# Patient Record
Sex: Female | Born: 1955 | Race: White | Hispanic: No | Marital: Married | Smoking: Never smoker
Health system: Southern US, Community
[De-identification: ages and names within clinical notes are randomized; demographics above are authoritative.]

## PROBLEM LIST (undated history)

## (undated) DIAGNOSIS — C801 Malignant (primary) neoplasm, unspecified: Secondary | ICD-10-CM

## (undated) DIAGNOSIS — D219 Benign neoplasm of connective and other soft tissue, unspecified: Secondary | ICD-10-CM

## (undated) DIAGNOSIS — I10 Essential (primary) hypertension: Secondary | ICD-10-CM

## (undated) DIAGNOSIS — E2740 Unspecified adrenocortical insufficiency: Secondary | ICD-10-CM

## (undated) HISTORY — PX: OTHER SURGICAL HISTORY: SHX169

## (undated) HISTORY — DX: Malignant (primary) neoplasm, unspecified: C80.1

## (undated) HISTORY — PX: TUBAL LIGATION: SHX77

## (undated) HISTORY — PX: ENDOMETRIAL ABLATION: SHX621

## (undated) HISTORY — DX: Essential (primary) hypertension: I10

## (undated) HISTORY — DX: Benign neoplasm of connective and other soft tissue, unspecified: D21.9

---

## 1998-07-17 ENCOUNTER — Other Ambulatory Visit: Admission: RE | Admit: 1998-07-17 | Discharge: 1998-07-17 | Payer: Self-pay | Admitting: Obstetrics and Gynecology

## 1998-08-01 ENCOUNTER — Ambulatory Visit (HOSPITAL_COMMUNITY): Admission: RE | Admit: 1998-08-01 | Discharge: 1998-08-01 | Payer: Self-pay | Admitting: Obstetrics and Gynecology

## 2000-04-25 ENCOUNTER — Ambulatory Visit (HOSPITAL_COMMUNITY): Admission: RE | Admit: 2000-04-25 | Discharge: 2000-04-25 | Payer: Self-pay | Admitting: Obstetrics and Gynecology

## 2000-04-25 ENCOUNTER — Encounter: Payer: Self-pay | Admitting: Obstetrics and Gynecology

## 2000-06-22 ENCOUNTER — Other Ambulatory Visit: Admission: RE | Admit: 2000-06-22 | Discharge: 2000-06-22 | Payer: Self-pay | Admitting: Obstetrics and Gynecology

## 2001-04-27 ENCOUNTER — Encounter: Payer: Self-pay | Admitting: Obstetrics and Gynecology

## 2001-04-27 ENCOUNTER — Ambulatory Visit (HOSPITAL_COMMUNITY): Admission: RE | Admit: 2001-04-27 | Discharge: 2001-04-27 | Payer: Self-pay | Admitting: Obstetrics and Gynecology

## 2002-08-16 ENCOUNTER — Encounter: Payer: Self-pay | Admitting: Obstetrics and Gynecology

## 2002-08-16 ENCOUNTER — Ambulatory Visit (HOSPITAL_COMMUNITY): Admission: RE | Admit: 2002-08-16 | Discharge: 2002-08-16 | Payer: Self-pay | Admitting: Obstetrics and Gynecology

## 2002-08-29 ENCOUNTER — Other Ambulatory Visit: Admission: RE | Admit: 2002-08-29 | Discharge: 2002-08-29 | Payer: Self-pay | Admitting: Obstetrics and Gynecology

## 2003-07-02 ENCOUNTER — Other Ambulatory Visit: Admission: RE | Admit: 2003-07-02 | Discharge: 2003-07-02 | Payer: Self-pay | Admitting: Obstetrics and Gynecology

## 2004-04-30 ENCOUNTER — Ambulatory Visit: Payer: Self-pay | Admitting: Family Medicine

## 2004-05-11 ENCOUNTER — Other Ambulatory Visit: Admission: RE | Admit: 2004-05-11 | Discharge: 2004-05-11 | Payer: Self-pay | Admitting: Obstetrics and Gynecology

## 2005-04-14 ENCOUNTER — Ambulatory Visit: Payer: Self-pay | Admitting: Gastroenterology

## 2005-05-26 ENCOUNTER — Other Ambulatory Visit: Admission: RE | Admit: 2005-05-26 | Discharge: 2005-05-26 | Payer: Self-pay | Admitting: Obstetrics and Gynecology

## 2006-05-16 ENCOUNTER — Other Ambulatory Visit: Admission: RE | Admit: 2006-05-16 | Discharge: 2006-05-16 | Payer: Self-pay | Admitting: Obstetrics and Gynecology

## 2006-12-23 ENCOUNTER — Ambulatory Visit: Payer: Self-pay | Admitting: Gastroenterology

## 2007-01-12 ENCOUNTER — Ambulatory Visit: Payer: Self-pay | Admitting: Gastroenterology

## 2007-02-22 ENCOUNTER — Ambulatory Visit: Payer: Self-pay | Admitting: Gastroenterology

## 2007-02-22 LAB — CONVERTED CEMR LAB
Fecal Occult Blood: NEGATIVE
OCCULT 1: NEGATIVE
OCCULT 2: NEGATIVE
OCCULT 3: NEGATIVE
OCCULT 4: NEGATIVE
OCCULT 5: NEGATIVE

## 2007-07-11 ENCOUNTER — Other Ambulatory Visit: Admission: RE | Admit: 2007-07-11 | Discharge: 2007-07-11 | Payer: Self-pay | Admitting: Obstetrics and Gynecology

## 2008-02-07 ENCOUNTER — Ambulatory Visit: Payer: Self-pay | Admitting: Obstetrics and Gynecology

## 2008-02-14 ENCOUNTER — Ambulatory Visit: Payer: Self-pay | Admitting: Obstetrics and Gynecology

## 2008-03-04 ENCOUNTER — Ambulatory Visit: Payer: Self-pay | Admitting: Obstetrics and Gynecology

## 2008-03-25 ENCOUNTER — Ambulatory Visit: Payer: Self-pay | Admitting: Obstetrics and Gynecology

## 2008-05-22 ENCOUNTER — Ambulatory Visit: Payer: Self-pay | Admitting: Obstetrics and Gynecology

## 2008-07-16 ENCOUNTER — Encounter: Payer: Self-pay | Admitting: Obstetrics and Gynecology

## 2008-07-16 ENCOUNTER — Other Ambulatory Visit: Admission: RE | Admit: 2008-07-16 | Discharge: 2008-07-16 | Payer: Self-pay | Admitting: Obstetrics and Gynecology

## 2008-07-16 ENCOUNTER — Ambulatory Visit: Payer: Self-pay | Admitting: Obstetrics and Gynecology

## 2009-07-22 ENCOUNTER — Other Ambulatory Visit: Admission: RE | Admit: 2009-07-22 | Discharge: 2009-07-22 | Payer: Self-pay | Admitting: Obstetrics and Gynecology

## 2009-07-22 ENCOUNTER — Ambulatory Visit: Payer: Self-pay | Admitting: Obstetrics and Gynecology

## 2010-08-04 ENCOUNTER — Encounter (INDEPENDENT_AMBULATORY_CARE_PROVIDER_SITE_OTHER): Payer: BC Managed Care – PPO | Admitting: Obstetrics and Gynecology

## 2010-08-04 ENCOUNTER — Other Ambulatory Visit: Payer: BC Managed Care – PPO

## 2010-08-04 ENCOUNTER — Other Ambulatory Visit (HOSPITAL_COMMUNITY)
Admission: RE | Admit: 2010-08-04 | Discharge: 2010-08-04 | Disposition: A | Discharge status: Home / Self Care | Payer: BC Managed Care – PPO | Source: Ambulatory Visit | Attending: Obstetrics and Gynecology | Admitting: Obstetrics and Gynecology

## 2010-08-04 ENCOUNTER — Other Ambulatory Visit: Payer: Self-pay | Admitting: Obstetrics and Gynecology

## 2010-08-04 DIAGNOSIS — Z01419 Encounter for gynecological examination (general) (routine) without abnormal findings: Secondary | ICD-10-CM

## 2010-08-04 DIAGNOSIS — Z124 Encounter for screening for malignant neoplasm of cervix: Secondary | ICD-10-CM | POA: Insufficient documentation

## 2010-08-04 DIAGNOSIS — N912 Amenorrhea, unspecified: Secondary | ICD-10-CM

## 2010-08-04 DIAGNOSIS — D251 Intramural leiomyoma of uterus: Secondary | ICD-10-CM

## 2010-08-04 DIAGNOSIS — D259 Leiomyoma of uterus, unspecified: Secondary | ICD-10-CM

## 2010-08-04 DIAGNOSIS — N8300 Follicular cyst of ovary, unspecified side: Secondary | ICD-10-CM

## 2011-07-20 ENCOUNTER — Other Ambulatory Visit: Payer: Self-pay | Admitting: *Deleted

## 2011-07-20 DIAGNOSIS — N6489 Other specified disorders of breast: Secondary | ICD-10-CM

## 2011-07-21 ENCOUNTER — Other Ambulatory Visit: Payer: Self-pay | Admitting: Obstetrics and Gynecology

## 2011-07-21 DIAGNOSIS — N6489 Other specified disorders of breast: Secondary | ICD-10-CM

## 2011-07-23 ENCOUNTER — Encounter: Payer: Self-pay | Admitting: Obstetrics and Gynecology

## 2011-07-29 ENCOUNTER — Encounter: Payer: Self-pay | Admitting: Gynecology

## 2011-07-29 DIAGNOSIS — I10 Essential (primary) hypertension: Secondary | ICD-10-CM | POA: Insufficient documentation

## 2011-08-09 ENCOUNTER — Ambulatory Visit (INDEPENDENT_AMBULATORY_CARE_PROVIDER_SITE_OTHER): Payer: BC Managed Care – PPO

## 2011-08-09 ENCOUNTER — Other Ambulatory Visit (HOSPITAL_COMMUNITY)
Admission: RE | Admit: 2011-08-09 | Discharge: 2011-08-09 | Disposition: A | Discharge status: Home / Self Care | Payer: BC Managed Care – PPO | Source: Ambulatory Visit | Attending: Obstetrics and Gynecology | Admitting: Obstetrics and Gynecology

## 2011-08-09 ENCOUNTER — Encounter: Payer: Self-pay | Admitting: Obstetrics and Gynecology

## 2011-08-09 ENCOUNTER — Ambulatory Visit (INDEPENDENT_AMBULATORY_CARE_PROVIDER_SITE_OTHER): Payer: BC Managed Care – PPO | Admitting: Obstetrics and Gynecology

## 2011-08-09 ENCOUNTER — Other Ambulatory Visit: Payer: Self-pay | Admitting: Obstetrics and Gynecology

## 2011-08-09 VITALS — BP 130/84 | Ht 66.0 in | Wt 180.0 lb

## 2011-08-09 DIAGNOSIS — C801 Malignant (primary) neoplasm, unspecified: Secondary | ICD-10-CM | POA: Insufficient documentation

## 2011-08-09 DIAGNOSIS — R1031 Right lower quadrant pain: Secondary | ICD-10-CM

## 2011-08-09 DIAGNOSIS — N912 Amenorrhea, unspecified: Secondary | ICD-10-CM

## 2011-08-09 DIAGNOSIS — Z01419 Encounter for gynecological examination (general) (routine) without abnormal findings: Secondary | ICD-10-CM

## 2011-08-09 DIAGNOSIS — N83339 Acquired atrophy of ovary and fallopian tube, unspecified side: Secondary | ICD-10-CM

## 2011-08-09 DIAGNOSIS — D251 Intramural leiomyoma of uterus: Secondary | ICD-10-CM

## 2011-08-09 DIAGNOSIS — D259 Leiomyoma of uterus, unspecified: Secondary | ICD-10-CM

## 2011-08-09 NOTE — Progress Notes (Addendum)
Patient came to see me today for her annual GYN exam. She had an elevated FSH last year. She has complete amenorrhea. Her hot flashes are subsiding. 3 weeks ago she had sharp right lower quadrant pain. It is now better. It has responded to Advil. On followup for her melanoma the dermatologist found the cyst on her vulva. The patient is asymptomatic. She has not had a bone density. She does her lab work from her PCP.she's never had an abnormal Pap smear. She understands the new guidelines but requested Pap smear today.  Physical examination: Kennon Portela present. HEENT within normal limits. Neck: Thyroid not large. No masses. Supraclavicular nodes: not enlarged. Breasts: Examined in both sitting and lying  position. No skin changes and no masses. Abdomen: Soft no guarding rebound or masses or hernia. Pelvic: External: Within normal limits. BUS: Within normal limits except for 2 mm sebaceous cyst on left labia-nonsuspicious. Vaginal:within normal limits. Good estrogen effect. No evidence of cystocele rectocele or enterocele. Cervix: clean. Uterus: Normal size and shape. Adnexa: No masses. Rectovaginal exam: Confirmatory and negative. Extremities: Within normal limits.  Assessment: #1. Recent right lower quadrant pain #2. Small sebaceous cyst left labia. #3. History of melanoma  Plan: Continue yearly mammograms. Bone density. Pelvic ultrasound.  Patient came back today and we went ahead and did her ultrasound. 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 The last time we did an ultrasound on her it was 2.5 cm. Her endometrial echo is 3.2 mm. Both her ovaries are normal. Her cul-de-sac is free of fluid. No masses were appreciated. The patient was reassured.

## 2011-08-10 LAB — URINALYSIS W MICROSCOPIC + REFLEX CULTURE
Bacteria, UA: NONE SEEN
Bilirubin Urine: NEGATIVE
Casts: NONE SEEN
Crystals: NONE SEEN
Glucose, UA: NEGATIVE mg/dL
Hgb urine dipstick: NEGATIVE
Ketones, ur: NEGATIVE mg/dL
Leukocytes, UA: NEGATIVE
Nitrite: NEGATIVE
Protein, ur: NEGATIVE mg/dL
Specific Gravity, Urine: 1.022 (ref 1.005–1.030)
Squamous Epithelial / HPF: NONE SEEN
Urobilinogen, UA: 0.2 mg/dL (ref 0.0–1.0)
pH: 7.5 (ref 5.0–8.0)

## 2014-01-07 ENCOUNTER — Encounter: Payer: Self-pay | Admitting: Obstetrics and Gynecology

## 2015-06-10 DIAGNOSIS — E230 Hypopituitarism: Secondary | ICD-10-CM | POA: Diagnosis not present

## 2015-06-10 DIAGNOSIS — E2749 Other adrenocortical insufficiency: Secondary | ICD-10-CM | POA: Diagnosis not present

## 2015-06-10 DIAGNOSIS — M79674 Pain in right toe(s): Secondary | ICD-10-CM | POA: Diagnosis not present

## 2015-06-10 DIAGNOSIS — E038 Other specified hypothyroidism: Secondary | ICD-10-CM | POA: Diagnosis not present

## 2015-06-17 DIAGNOSIS — Z01419 Encounter for gynecological examination (general) (routine) without abnormal findings: Secondary | ICD-10-CM | POA: Diagnosis not present

## 2015-07-10 DIAGNOSIS — Z85828 Personal history of other malignant neoplasm of skin: Secondary | ICD-10-CM | POA: Diagnosis not present

## 2015-07-10 DIAGNOSIS — D492 Neoplasm of unspecified behavior of bone, soft tissue, and skin: Secondary | ICD-10-CM | POA: Diagnosis not present

## 2015-07-10 DIAGNOSIS — D2270 Melanocytic nevi of unspecified lower limb, including hip: Secondary | ICD-10-CM | POA: Diagnosis not present

## 2015-07-10 DIAGNOSIS — B353 Tinea pedis: Secondary | ICD-10-CM | POA: Diagnosis not present

## 2015-07-10 DIAGNOSIS — D899 Disorder involving the immune mechanism, unspecified: Secondary | ICD-10-CM | POA: Diagnosis not present

## 2015-07-10 DIAGNOSIS — R945 Abnormal results of liver function studies: Secondary | ICD-10-CM | POA: Diagnosis not present

## 2015-07-10 DIAGNOSIS — I890 Lymphedema, not elsewhere classified: Secondary | ICD-10-CM | POA: Diagnosis not present

## 2015-07-10 DIAGNOSIS — L565 Disseminated superficial actinic porokeratosis (DSAP): Secondary | ICD-10-CM | POA: Diagnosis not present

## 2015-07-10 DIAGNOSIS — Z9289 Personal history of other medical treatment: Secondary | ICD-10-CM | POA: Diagnosis not present

## 2015-07-10 DIAGNOSIS — L570 Actinic keratosis: Secondary | ICD-10-CM | POA: Diagnosis not present

## 2015-07-10 DIAGNOSIS — C799 Secondary malignant neoplasm of unspecified site: Secondary | ICD-10-CM | POA: Diagnosis not present

## 2015-07-14 DIAGNOSIS — M79671 Pain in right foot: Secondary | ICD-10-CM | POA: Diagnosis not present

## 2015-07-14 DIAGNOSIS — K719 Toxic liver disease, unspecified: Secondary | ICD-10-CM | POA: Diagnosis not present

## 2015-07-14 DIAGNOSIS — C438 Malignant melanoma of overlapping sites of skin: Secondary | ICD-10-CM | POA: Diagnosis not present

## 2015-07-14 DIAGNOSIS — E038 Other specified hypothyroidism: Secondary | ICD-10-CM | POA: Diagnosis not present

## 2015-07-14 DIAGNOSIS — E2749 Other adrenocortical insufficiency: Secondary | ICD-10-CM | POA: Diagnosis not present

## 2015-07-14 DIAGNOSIS — C439 Malignant melanoma of skin, unspecified: Secondary | ICD-10-CM | POA: Diagnosis not present

## 2015-07-14 DIAGNOSIS — C779 Secondary and unspecified malignant neoplasm of lymph node, unspecified: Secondary | ICD-10-CM | POA: Diagnosis not present

## 2015-07-14 DIAGNOSIS — Z9289 Personal history of other medical treatment: Secondary | ICD-10-CM | POA: Diagnosis not present

## 2015-07-15 DIAGNOSIS — M659 Synovitis and tenosynovitis, unspecified: Secondary | ICD-10-CM | POA: Diagnosis not present

## 2015-07-15 DIAGNOSIS — M79671 Pain in right foot: Secondary | ICD-10-CM | POA: Diagnosis not present

## 2015-07-15 DIAGNOSIS — G5761 Lesion of plantar nerve, right lower limb: Secondary | ICD-10-CM | POA: Diagnosis not present

## 2015-08-05 DIAGNOSIS — R05 Cough: Secondary | ICD-10-CM | POA: Diagnosis not present

## 2015-08-05 DIAGNOSIS — I10 Essential (primary) hypertension: Secondary | ICD-10-CM | POA: Diagnosis not present

## 2015-08-05 DIAGNOSIS — Z79899 Other long term (current) drug therapy: Secondary | ICD-10-CM | POA: Diagnosis not present

## 2015-08-05 DIAGNOSIS — R5383 Other fatigue: Secondary | ICD-10-CM | POA: Diagnosis not present

## 2015-08-25 DIAGNOSIS — C779 Secondary and unspecified malignant neoplasm of lymph node, unspecified: Secondary | ICD-10-CM | POA: Diagnosis not present

## 2015-08-25 DIAGNOSIS — Z9289 Personal history of other medical treatment: Secondary | ICD-10-CM | POA: Diagnosis not present

## 2015-08-25 DIAGNOSIS — Z85820 Personal history of malignant melanoma of skin: Secondary | ICD-10-CM | POA: Diagnosis not present

## 2015-08-25 DIAGNOSIS — E2749 Other adrenocortical insufficiency: Secondary | ICD-10-CM | POA: Diagnosis not present

## 2015-08-25 DIAGNOSIS — Z08 Encounter for follow-up examination after completed treatment for malignant neoplasm: Secondary | ICD-10-CM | POA: Diagnosis not present

## 2015-08-25 DIAGNOSIS — R911 Solitary pulmonary nodule: Secondary | ICD-10-CM | POA: Diagnosis not present

## 2015-08-25 DIAGNOSIS — C439 Malignant melanoma of skin, unspecified: Secondary | ICD-10-CM | POA: Diagnosis not present

## 2015-08-25 DIAGNOSIS — E038 Other specified hypothyroidism: Secondary | ICD-10-CM | POA: Diagnosis not present

## 2015-09-16 DIAGNOSIS — E2749 Other adrenocortical insufficiency: Secondary | ICD-10-CM | POA: Diagnosis not present

## 2015-09-16 DIAGNOSIS — E230 Hypopituitarism: Secondary | ICD-10-CM | POA: Diagnosis not present

## 2015-09-16 DIAGNOSIS — E042 Nontoxic multinodular goiter: Secondary | ICD-10-CM | POA: Diagnosis not present

## 2015-09-16 DIAGNOSIS — E038 Other specified hypothyroidism: Secondary | ICD-10-CM | POA: Diagnosis not present

## 2015-09-17 DIAGNOSIS — C799 Secondary malignant neoplasm of unspecified site: Secondary | ICD-10-CM | POA: Diagnosis not present

## 2015-09-17 DIAGNOSIS — L570 Actinic keratosis: Secondary | ICD-10-CM | POA: Diagnosis not present

## 2015-09-22 DIAGNOSIS — K719 Toxic liver disease, unspecified: Secondary | ICD-10-CM | POA: Diagnosis not present

## 2015-10-13 DIAGNOSIS — K719 Toxic liver disease, unspecified: Secondary | ICD-10-CM | POA: Diagnosis not present

## 2015-10-28 DIAGNOSIS — Z79899 Other long term (current) drug therapy: Secondary | ICD-10-CM | POA: Diagnosis not present

## 2015-10-28 DIAGNOSIS — X58XXXA Exposure to other specified factors, initial encounter: Secondary | ICD-10-CM | POA: Diagnosis not present

## 2015-10-28 DIAGNOSIS — L570 Actinic keratosis: Secondary | ICD-10-CM | POA: Diagnosis not present

## 2015-10-28 DIAGNOSIS — D492 Neoplasm of unspecified behavior of bone, soft tissue, and skin: Secondary | ICD-10-CM | POA: Diagnosis not present

## 2015-10-28 DIAGNOSIS — C44729 Squamous cell carcinoma of skin of left lower limb, including hip: Secondary | ICD-10-CM | POA: Diagnosis not present

## 2015-10-28 DIAGNOSIS — C779 Secondary and unspecified malignant neoplasm of lymph node, unspecified: Secondary | ICD-10-CM | POA: Diagnosis not present

## 2015-10-28 DIAGNOSIS — L820 Inflamed seborrheic keratosis: Secondary | ICD-10-CM | POA: Diagnosis not present

## 2015-10-28 DIAGNOSIS — C799 Secondary malignant neoplasm of unspecified site: Secondary | ICD-10-CM | POA: Diagnosis not present

## 2015-10-28 DIAGNOSIS — C4492 Squamous cell carcinoma of skin, unspecified: Secondary | ICD-10-CM | POA: Diagnosis not present

## 2015-10-28 DIAGNOSIS — S6010XA Contusion of unspecified finger with damage to nail, initial encounter: Secondary | ICD-10-CM | POA: Diagnosis not present

## 2015-10-28 DIAGNOSIS — B351 Tinea unguium: Secondary | ICD-10-CM | POA: Diagnosis not present

## 2015-10-29 DIAGNOSIS — C4359 Malignant melanoma of other part of trunk: Secondary | ICD-10-CM | POA: Diagnosis not present

## 2015-10-29 DIAGNOSIS — C779 Secondary and unspecified malignant neoplasm of lymph node, unspecified: Secondary | ICD-10-CM | POA: Diagnosis not present

## 2015-10-29 DIAGNOSIS — C439 Malignant melanoma of skin, unspecified: Secondary | ICD-10-CM | POA: Diagnosis not present

## 2015-11-04 DIAGNOSIS — M25511 Pain in right shoulder: Secondary | ICD-10-CM | POA: Diagnosis not present

## 2015-11-04 DIAGNOSIS — M542 Cervicalgia: Secondary | ICD-10-CM | POA: Diagnosis not present

## 2015-11-18 DIAGNOSIS — Z85828 Personal history of other malignant neoplasm of skin: Secondary | ICD-10-CM | POA: Diagnosis not present

## 2015-11-18 DIAGNOSIS — K719 Toxic liver disease, unspecified: Secondary | ICD-10-CM | POA: Diagnosis not present

## 2015-11-18 DIAGNOSIS — Z9289 Personal history of other medical treatment: Secondary | ICD-10-CM | POA: Diagnosis not present

## 2015-11-24 DIAGNOSIS — C439 Malignant melanoma of skin, unspecified: Secondary | ICD-10-CM | POA: Diagnosis not present

## 2015-11-24 DIAGNOSIS — K719 Toxic liver disease, unspecified: Secondary | ICD-10-CM | POA: Diagnosis not present

## 2015-11-24 DIAGNOSIS — C779 Secondary and unspecified malignant neoplasm of lymph node, unspecified: Secondary | ICD-10-CM | POA: Diagnosis not present

## 2015-11-24 DIAGNOSIS — Z9289 Personal history of other medical treatment: Secondary | ICD-10-CM | POA: Diagnosis not present

## 2015-11-24 DIAGNOSIS — Z85820 Personal history of malignant melanoma of skin: Secondary | ICD-10-CM | POA: Diagnosis not present

## 2015-11-24 DIAGNOSIS — R911 Solitary pulmonary nodule: Secondary | ICD-10-CM | POA: Diagnosis not present

## 2015-11-24 DIAGNOSIS — E2749 Other adrenocortical insufficiency: Secondary | ICD-10-CM | POA: Diagnosis not present

## 2015-11-25 DIAGNOSIS — L905 Scar conditions and fibrosis of skin: Secondary | ICD-10-CM | POA: Diagnosis not present

## 2015-11-25 DIAGNOSIS — L570 Actinic keratosis: Secondary | ICD-10-CM | POA: Diagnosis not present

## 2015-11-25 DIAGNOSIS — D492 Neoplasm of unspecified behavior of bone, soft tissue, and skin: Secondary | ICD-10-CM | POA: Diagnosis not present

## 2015-11-25 DIAGNOSIS — C44729 Squamous cell carcinoma of skin of left lower limb, including hip: Secondary | ICD-10-CM | POA: Diagnosis not present

## 2015-12-02 DIAGNOSIS — C44729 Squamous cell carcinoma of skin of left lower limb, including hip: Secondary | ICD-10-CM | POA: Diagnosis not present

## 2015-12-02 DIAGNOSIS — E2740 Unspecified adrenocortical insufficiency: Secondary | ICD-10-CM | POA: Diagnosis not present

## 2015-12-02 DIAGNOSIS — Z9289 Personal history of other medical treatment: Secondary | ICD-10-CM | POA: Diagnosis not present

## 2015-12-11 DIAGNOSIS — Z23 Encounter for immunization: Secondary | ICD-10-CM | POA: Diagnosis not present

## 2015-12-16 DIAGNOSIS — E2749 Other adrenocortical insufficiency: Secondary | ICD-10-CM | POA: Diagnosis not present

## 2015-12-16 DIAGNOSIS — E230 Hypopituitarism: Secondary | ICD-10-CM | POA: Diagnosis not present

## 2015-12-16 DIAGNOSIS — Z85828 Personal history of other malignant neoplasm of skin: Secondary | ICD-10-CM | POA: Diagnosis not present

## 2015-12-16 DIAGNOSIS — Z483 Aftercare following surgery for neoplasm: Secondary | ICD-10-CM | POA: Diagnosis not present

## 2015-12-16 DIAGNOSIS — E038 Other specified hypothyroidism: Secondary | ICD-10-CM | POA: Diagnosis not present

## 2015-12-16 DIAGNOSIS — K719 Toxic liver disease, unspecified: Secondary | ICD-10-CM | POA: Diagnosis not present

## 2015-12-16 DIAGNOSIS — D235 Other benign neoplasm of skin of trunk: Secondary | ICD-10-CM | POA: Diagnosis not present

## 2015-12-16 DIAGNOSIS — L820 Inflamed seborrheic keratosis: Secondary | ICD-10-CM | POA: Diagnosis not present

## 2015-12-16 DIAGNOSIS — C44729 Squamous cell carcinoma of skin of left lower limb, including hip: Secondary | ICD-10-CM | POA: Diagnosis not present

## 2015-12-16 DIAGNOSIS — C799 Secondary malignant neoplasm of unspecified site: Secondary | ICD-10-CM | POA: Diagnosis not present

## 2015-12-16 DIAGNOSIS — Z79899 Other long term (current) drug therapy: Secondary | ICD-10-CM | POA: Diagnosis not present

## 2015-12-23 DIAGNOSIS — Z483 Aftercare following surgery for neoplasm: Secondary | ICD-10-CM | POA: Diagnosis not present

## 2015-12-23 DIAGNOSIS — C44729 Squamous cell carcinoma of skin of left lower limb, including hip: Secondary | ICD-10-CM | POA: Diagnosis not present

## 2015-12-24 DIAGNOSIS — Z0000 Encounter for general adult medical examination without abnormal findings: Secondary | ICD-10-CM | POA: Diagnosis not present

## 2015-12-24 DIAGNOSIS — Z23 Encounter for immunization: Secondary | ICD-10-CM | POA: Diagnosis not present

## 2015-12-24 DIAGNOSIS — R7301 Impaired fasting glucose: Secondary | ICD-10-CM | POA: Diagnosis not present

## 2015-12-24 DIAGNOSIS — E559 Vitamin D deficiency, unspecified: Secondary | ICD-10-CM | POA: Diagnosis not present

## 2015-12-24 DIAGNOSIS — C439 Malignant melanoma of skin, unspecified: Secondary | ICD-10-CM | POA: Diagnosis not present

## 2015-12-24 DIAGNOSIS — I10 Essential (primary) hypertension: Secondary | ICD-10-CM | POA: Diagnosis not present

## 2015-12-24 DIAGNOSIS — Z1389 Encounter for screening for other disorder: Secondary | ICD-10-CM | POA: Diagnosis not present

## 2016-01-13 DIAGNOSIS — L570 Actinic keratosis: Secondary | ICD-10-CM | POA: Diagnosis not present

## 2016-01-13 DIAGNOSIS — C799 Secondary malignant neoplasm of unspecified site: Secondary | ICD-10-CM | POA: Diagnosis not present

## 2016-01-13 DIAGNOSIS — Z85828 Personal history of other malignant neoplasm of skin: Secondary | ICD-10-CM | POA: Diagnosis not present

## 2016-01-13 DIAGNOSIS — B009 Herpesviral infection, unspecified: Secondary | ICD-10-CM | POA: Diagnosis not present

## 2016-02-03 DIAGNOSIS — L821 Other seborrheic keratosis: Secondary | ICD-10-CM | POA: Diagnosis not present

## 2016-02-03 DIAGNOSIS — K719 Toxic liver disease, unspecified: Secondary | ICD-10-CM | POA: Diagnosis not present

## 2016-02-03 DIAGNOSIS — Z85828 Personal history of other malignant neoplasm of skin: Secondary | ICD-10-CM | POA: Diagnosis not present

## 2016-02-03 DIAGNOSIS — C799 Secondary malignant neoplasm of unspecified site: Secondary | ICD-10-CM | POA: Diagnosis not present

## 2016-02-03 DIAGNOSIS — L570 Actinic keratosis: Secondary | ICD-10-CM | POA: Diagnosis not present

## 2016-02-10 DIAGNOSIS — H25813 Combined forms of age-related cataract, bilateral: Secondary | ICD-10-CM | POA: Diagnosis not present

## 2016-02-10 DIAGNOSIS — H04123 Dry eye syndrome of bilateral lacrimal glands: Secondary | ICD-10-CM | POA: Diagnosis not present

## 2016-02-18 DIAGNOSIS — K719 Toxic liver disease, unspecified: Secondary | ICD-10-CM | POA: Diagnosis not present

## 2016-02-23 DIAGNOSIS — E042 Nontoxic multinodular goiter: Secondary | ICD-10-CM | POA: Diagnosis not present

## 2016-02-24 DIAGNOSIS — Z1212 Encounter for screening for malignant neoplasm of rectum: Secondary | ICD-10-CM | POA: Diagnosis not present

## 2016-03-03 DIAGNOSIS — Z85831 Personal history of malignant neoplasm of soft tissue: Secondary | ICD-10-CM | POA: Diagnosis not present

## 2016-03-03 DIAGNOSIS — E236 Other disorders of pituitary gland: Secondary | ICD-10-CM | POA: Diagnosis not present

## 2016-03-03 DIAGNOSIS — Z08 Encounter for follow-up examination after completed treatment for malignant neoplasm: Secondary | ICD-10-CM | POA: Diagnosis not present

## 2016-03-03 DIAGNOSIS — E038 Other specified hypothyroidism: Secondary | ICD-10-CM | POA: Diagnosis not present

## 2016-03-03 DIAGNOSIS — I890 Lymphedema, not elsewhere classified: Secondary | ICD-10-CM | POA: Diagnosis not present

## 2016-03-03 DIAGNOSIS — C439 Malignant melanoma of skin, unspecified: Secondary | ICD-10-CM | POA: Diagnosis not present

## 2016-03-16 DIAGNOSIS — E038 Other specified hypothyroidism: Secondary | ICD-10-CM | POA: Diagnosis not present

## 2016-03-16 DIAGNOSIS — Z79899 Other long term (current) drug therapy: Secondary | ICD-10-CM | POA: Diagnosis not present

## 2016-03-16 DIAGNOSIS — E2749 Other adrenocortical insufficiency: Secondary | ICD-10-CM | POA: Diagnosis not present

## 2016-03-16 DIAGNOSIS — Z9289 Personal history of other medical treatment: Secondary | ICD-10-CM | POA: Diagnosis not present

## 2016-03-16 DIAGNOSIS — E230 Hypopituitarism: Secondary | ICD-10-CM | POA: Diagnosis not present

## 2016-03-23 DIAGNOSIS — E038 Other specified hypothyroidism: Secondary | ICD-10-CM | POA: Diagnosis not present

## 2016-03-23 DIAGNOSIS — K719 Toxic liver disease, unspecified: Secondary | ICD-10-CM | POA: Diagnosis not present

## 2016-04-07 DIAGNOSIS — Z1231 Encounter for screening mammogram for malignant neoplasm of breast: Secondary | ICD-10-CM | POA: Diagnosis not present

## 2016-04-17 DIAGNOSIS — Z79899 Other long term (current) drug therapy: Secondary | ICD-10-CM | POA: Diagnosis not present

## 2016-04-17 DIAGNOSIS — Z888 Allergy status to other drugs, medicaments and biological substances status: Secondary | ICD-10-CM | POA: Diagnosis not present

## 2016-04-17 DIAGNOSIS — R112 Nausea with vomiting, unspecified: Secondary | ICD-10-CM | POA: Diagnosis not present

## 2016-04-17 DIAGNOSIS — R000 Tachycardia, unspecified: Secondary | ICD-10-CM | POA: Diagnosis not present

## 2016-04-17 DIAGNOSIS — R1111 Vomiting without nausea: Secondary | ICD-10-CM | POA: Diagnosis not present

## 2016-04-17 DIAGNOSIS — I10 Essential (primary) hypertension: Secondary | ICD-10-CM | POA: Diagnosis not present

## 2016-04-17 DIAGNOSIS — R197 Diarrhea, unspecified: Secondary | ICD-10-CM | POA: Diagnosis not present

## 2016-04-20 DIAGNOSIS — D2270 Melanocytic nevi of unspecified lower limb, including hip: Secondary | ICD-10-CM | POA: Diagnosis not present

## 2016-04-20 DIAGNOSIS — Z85828 Personal history of other malignant neoplasm of skin: Secondary | ICD-10-CM | POA: Diagnosis not present

## 2016-04-20 DIAGNOSIS — Z85820 Personal history of malignant melanoma of skin: Secondary | ICD-10-CM | POA: Diagnosis not present

## 2016-04-20 DIAGNOSIS — L570 Actinic keratosis: Secondary | ICD-10-CM | POA: Diagnosis not present

## 2016-04-28 DIAGNOSIS — Z08 Encounter for follow-up examination after completed treatment for malignant neoplasm: Secondary | ICD-10-CM | POA: Diagnosis not present

## 2016-04-28 DIAGNOSIS — Z85820 Personal history of malignant melanoma of skin: Secondary | ICD-10-CM | POA: Diagnosis not present

## 2016-05-13 DIAGNOSIS — R600 Localized edema: Secondary | ICD-10-CM | POA: Diagnosis not present

## 2016-05-17 DIAGNOSIS — C44729 Squamous cell carcinoma of skin of left lower limb, including hip: Secondary | ICD-10-CM | POA: Diagnosis not present

## 2016-05-25 DIAGNOSIS — C439 Malignant melanoma of skin, unspecified: Secondary | ICD-10-CM | POA: Diagnosis not present

## 2016-05-25 DIAGNOSIS — C779 Secondary and unspecified malignant neoplasm of lymph node, unspecified: Secondary | ICD-10-CM | POA: Diagnosis not present

## 2016-05-25 DIAGNOSIS — K719 Toxic liver disease, unspecified: Secondary | ICD-10-CM | POA: Diagnosis not present

## 2016-05-31 DIAGNOSIS — Z08 Encounter for follow-up examination after completed treatment for malignant neoplasm: Secondary | ICD-10-CM | POA: Diagnosis not present

## 2016-05-31 DIAGNOSIS — E230 Hypopituitarism: Secondary | ICD-10-CM | POA: Diagnosis not present

## 2016-05-31 DIAGNOSIS — C779 Secondary and unspecified malignant neoplasm of lymph node, unspecified: Secondary | ICD-10-CM | POA: Diagnosis not present

## 2016-05-31 DIAGNOSIS — C439 Malignant melanoma of skin, unspecified: Secondary | ICD-10-CM | POA: Diagnosis not present

## 2016-05-31 DIAGNOSIS — K719 Toxic liver disease, unspecified: Secondary | ICD-10-CM | POA: Diagnosis not present

## 2016-05-31 DIAGNOSIS — Z9289 Personal history of other medical treatment: Secondary | ICD-10-CM | POA: Diagnosis not present

## 2016-05-31 DIAGNOSIS — Z85820 Personal history of malignant melanoma of skin: Secondary | ICD-10-CM | POA: Diagnosis not present

## 2016-06-17 DIAGNOSIS — M62838 Other muscle spasm: Secondary | ICD-10-CM | POA: Diagnosis not present

## 2016-06-17 DIAGNOSIS — Z01411 Encounter for gynecological examination (general) (routine) with abnormal findings: Secondary | ICD-10-CM | POA: Diagnosis not present

## 2016-06-17 DIAGNOSIS — Z13820 Encounter for screening for osteoporosis: Secondary | ICD-10-CM | POA: Diagnosis not present

## 2016-06-17 DIAGNOSIS — N905 Atrophy of vulva: Secondary | ICD-10-CM | POA: Diagnosis not present

## 2016-07-06 DIAGNOSIS — L821 Other seborrheic keratosis: Secondary | ICD-10-CM | POA: Diagnosis not present

## 2016-07-06 DIAGNOSIS — D2260 Melanocytic nevi of unspecified upper limb, including shoulder: Secondary | ICD-10-CM | POA: Diagnosis not present

## 2016-07-06 DIAGNOSIS — K719 Toxic liver disease, unspecified: Secondary | ICD-10-CM | POA: Diagnosis not present

## 2016-07-06 DIAGNOSIS — L565 Disseminated superficial actinic porokeratosis (DSAP): Secondary | ICD-10-CM | POA: Diagnosis not present

## 2016-07-06 DIAGNOSIS — D1800 Hemangioma unspecified site: Secondary | ICD-10-CM | POA: Diagnosis not present

## 2016-07-06 DIAGNOSIS — D2270 Melanocytic nevi of unspecified lower limb, including hip: Secondary | ICD-10-CM | POA: Diagnosis not present

## 2016-07-06 DIAGNOSIS — D225 Melanocytic nevi of trunk: Secondary | ICD-10-CM | POA: Diagnosis not present

## 2016-07-06 DIAGNOSIS — Z85828 Personal history of other malignant neoplasm of skin: Secondary | ICD-10-CM | POA: Diagnosis not present

## 2016-07-06 DIAGNOSIS — L570 Actinic keratosis: Secondary | ICD-10-CM | POA: Diagnosis not present

## 2016-07-06 DIAGNOSIS — L814 Other melanin hyperpigmentation: Secondary | ICD-10-CM | POA: Diagnosis not present

## 2016-07-06 DIAGNOSIS — C799 Secondary malignant neoplasm of unspecified site: Secondary | ICD-10-CM | POA: Diagnosis not present

## 2016-08-30 DIAGNOSIS — K719 Toxic liver disease, unspecified: Secondary | ICD-10-CM | POA: Diagnosis not present

## 2016-08-30 DIAGNOSIS — Z9289 Personal history of other medical treatment: Secondary | ICD-10-CM | POA: Diagnosis not present

## 2016-08-30 DIAGNOSIS — C779 Secondary and unspecified malignant neoplasm of lymph node, unspecified: Secondary | ICD-10-CM | POA: Diagnosis not present

## 2016-08-30 DIAGNOSIS — C439 Malignant melanoma of skin, unspecified: Secondary | ICD-10-CM | POA: Diagnosis not present

## 2016-09-21 DIAGNOSIS — D2270 Melanocytic nevi of unspecified lower limb, including hip: Secondary | ICD-10-CM | POA: Diagnosis not present

## 2016-09-21 DIAGNOSIS — L814 Other melanin hyperpigmentation: Secondary | ICD-10-CM | POA: Diagnosis not present

## 2016-09-21 DIAGNOSIS — Z85828 Personal history of other malignant neoplasm of skin: Secondary | ICD-10-CM | POA: Diagnosis not present

## 2016-09-21 DIAGNOSIS — D1800 Hemangioma unspecified site: Secondary | ICD-10-CM | POA: Diagnosis not present

## 2016-09-21 DIAGNOSIS — Z85820 Personal history of malignant melanoma of skin: Secondary | ICD-10-CM | POA: Diagnosis not present

## 2016-09-21 DIAGNOSIS — E038 Other specified hypothyroidism: Secondary | ICD-10-CM | POA: Diagnosis not present

## 2016-09-21 DIAGNOSIS — C799 Secondary malignant neoplasm of unspecified site: Secondary | ICD-10-CM | POA: Diagnosis not present

## 2016-09-21 DIAGNOSIS — L565 Disseminated superficial actinic porokeratosis (DSAP): Secondary | ICD-10-CM | POA: Diagnosis not present

## 2016-09-21 DIAGNOSIS — D485 Neoplasm of uncertain behavior of skin: Secondary | ICD-10-CM | POA: Diagnosis not present

## 2016-09-21 DIAGNOSIS — L821 Other seborrheic keratosis: Secondary | ICD-10-CM | POA: Diagnosis not present

## 2016-09-21 DIAGNOSIS — E230 Hypopituitarism: Secondary | ICD-10-CM | POA: Diagnosis not present

## 2016-09-21 DIAGNOSIS — L308 Other specified dermatitis: Secondary | ICD-10-CM | POA: Diagnosis not present

## 2016-09-21 DIAGNOSIS — D225 Melanocytic nevi of trunk: Secondary | ICD-10-CM | POA: Diagnosis not present

## 2016-09-21 DIAGNOSIS — E2749 Other adrenocortical insufficiency: Secondary | ICD-10-CM | POA: Diagnosis not present

## 2016-09-21 DIAGNOSIS — D2260 Melanocytic nevi of unspecified upper limb, including shoulder: Secondary | ICD-10-CM | POA: Diagnosis not present

## 2016-09-22 DIAGNOSIS — L989 Disorder of the skin and subcutaneous tissue, unspecified: Secondary | ICD-10-CM | POA: Diagnosis not present

## 2016-09-22 DIAGNOSIS — Z85820 Personal history of malignant melanoma of skin: Secondary | ICD-10-CM | POA: Diagnosis not present

## 2016-09-22 DIAGNOSIS — Z08 Encounter for follow-up examination after completed treatment for malignant neoplasm: Secondary | ICD-10-CM | POA: Diagnosis not present

## 2016-10-04 DIAGNOSIS — A6920 Lyme disease, unspecified: Secondary | ICD-10-CM | POA: Diagnosis not present

## 2016-10-04 DIAGNOSIS — L309 Dermatitis, unspecified: Secondary | ICD-10-CM | POA: Diagnosis not present

## 2016-10-12 DIAGNOSIS — L309 Dermatitis, unspecified: Secondary | ICD-10-CM | POA: Diagnosis not present

## 2016-10-12 DIAGNOSIS — B358 Other dermatophytoses: Secondary | ICD-10-CM | POA: Diagnosis not present

## 2016-10-12 DIAGNOSIS — A6920 Lyme disease, unspecified: Secondary | ICD-10-CM | POA: Diagnosis not present

## 2016-11-22 DIAGNOSIS — K719 Toxic liver disease, unspecified: Secondary | ICD-10-CM | POA: Diagnosis not present

## 2016-11-22 DIAGNOSIS — I890 Lymphedema, not elsewhere classified: Secondary | ICD-10-CM | POA: Diagnosis not present

## 2016-11-22 DIAGNOSIS — Z9289 Personal history of other medical treatment: Secondary | ICD-10-CM | POA: Diagnosis not present

## 2016-11-22 DIAGNOSIS — C779 Secondary and unspecified malignant neoplasm of lymph node, unspecified: Secondary | ICD-10-CM | POA: Diagnosis not present

## 2016-11-22 DIAGNOSIS — C439 Malignant melanoma of skin, unspecified: Secondary | ICD-10-CM | POA: Diagnosis not present

## 2016-11-22 DIAGNOSIS — R911 Solitary pulmonary nodule: Secondary | ICD-10-CM | POA: Diagnosis not present

## 2016-11-23 DIAGNOSIS — D2270 Melanocytic nevi of unspecified lower limb, including hip: Secondary | ICD-10-CM | POA: Diagnosis not present

## 2016-11-23 DIAGNOSIS — L851 Acquired keratosis [keratoderma] palmaris et plantaris: Secondary | ICD-10-CM | POA: Diagnosis not present

## 2016-11-23 DIAGNOSIS — Z8619 Personal history of other infectious and parasitic diseases: Secondary | ICD-10-CM | POA: Diagnosis not present

## 2016-11-23 DIAGNOSIS — B358 Other dermatophytoses: Secondary | ICD-10-CM | POA: Diagnosis not present

## 2016-11-23 DIAGNOSIS — C799 Secondary malignant neoplasm of unspecified site: Secondary | ICD-10-CM | POA: Diagnosis not present

## 2016-11-23 DIAGNOSIS — D225 Melanocytic nevi of trunk: Secondary | ICD-10-CM | POA: Diagnosis not present

## 2016-11-23 DIAGNOSIS — L821 Other seborrheic keratosis: Secondary | ICD-10-CM | POA: Diagnosis not present

## 2016-11-23 DIAGNOSIS — D2260 Melanocytic nevi of unspecified upper limb, including shoulder: Secondary | ICD-10-CM | POA: Diagnosis not present

## 2016-11-23 DIAGNOSIS — K719 Toxic liver disease, unspecified: Secondary | ICD-10-CM | POA: Diagnosis not present

## 2016-11-23 DIAGNOSIS — A6920 Lyme disease, unspecified: Secondary | ICD-10-CM | POA: Diagnosis not present

## 2016-11-23 DIAGNOSIS — Z85828 Personal history of other malignant neoplasm of skin: Secondary | ICD-10-CM | POA: Diagnosis not present

## 2016-12-23 DIAGNOSIS — E038 Other specified hypothyroidism: Secondary | ICD-10-CM | POA: Diagnosis not present

## 2016-12-23 DIAGNOSIS — E230 Hypopituitarism: Secondary | ICD-10-CM | POA: Diagnosis not present

## 2016-12-23 DIAGNOSIS — E2749 Other adrenocortical insufficiency: Secondary | ICD-10-CM | POA: Diagnosis not present

## 2016-12-28 DIAGNOSIS — I10 Essential (primary) hypertension: Secondary | ICD-10-CM | POA: Diagnosis not present

## 2016-12-28 DIAGNOSIS — E038 Other specified hypothyroidism: Secondary | ICD-10-CM | POA: Diagnosis not present

## 2016-12-28 DIAGNOSIS — R7301 Impaired fasting glucose: Secondary | ICD-10-CM | POA: Diagnosis not present

## 2016-12-28 DIAGNOSIS — M859 Disorder of bone density and structure, unspecified: Secondary | ICD-10-CM | POA: Diagnosis not present

## 2017-01-04 DIAGNOSIS — E038 Other specified hypothyroidism: Secondary | ICD-10-CM | POA: Diagnosis not present

## 2017-01-04 DIAGNOSIS — E230 Hypopituitarism: Secondary | ICD-10-CM | POA: Diagnosis not present

## 2017-01-04 DIAGNOSIS — Z8719 Personal history of other diseases of the digestive system: Secondary | ICD-10-CM | POA: Diagnosis not present

## 2017-01-04 DIAGNOSIS — I10 Essential (primary) hypertension: Secondary | ICD-10-CM | POA: Diagnosis not present

## 2017-01-04 DIAGNOSIS — Z1389 Encounter for screening for other disorder: Secondary | ICD-10-CM | POA: Diagnosis not present

## 2017-01-04 DIAGNOSIS — R7301 Impaired fasting glucose: Secondary | ICD-10-CM | POA: Diagnosis not present

## 2017-01-04 DIAGNOSIS — Z0000 Encounter for general adult medical examination without abnormal findings: Secondary | ICD-10-CM | POA: Diagnosis not present

## 2017-01-05 ENCOUNTER — Encounter (HOSPITAL_COMMUNITY): Payer: Self-pay | Admitting: Emergency Medicine

## 2017-01-05 ENCOUNTER — Emergency Department (HOSPITAL_COMMUNITY): Payer: BLUE CROSS/BLUE SHIELD

## 2017-01-05 ENCOUNTER — Encounter: Payer: Self-pay | Admitting: Internal Medicine

## 2017-01-05 ENCOUNTER — Inpatient Hospital Stay (HOSPITAL_COMMUNITY)
Admission: EM | Admit: 2017-01-05 | Discharge: 2017-01-08 | DRG: 184 | Disposition: A | Discharge status: Home Health | Payer: BLUE CROSS/BLUE SHIELD | Attending: Surgery | Admitting: Surgery

## 2017-01-05 DIAGNOSIS — I10 Essential (primary) hypertension: Secondary | ICD-10-CM | POA: Diagnosis not present

## 2017-01-05 DIAGNOSIS — Z888 Allergy status to other drugs, medicaments and biological substances status: Secondary | ICD-10-CM | POA: Diagnosis not present

## 2017-01-05 DIAGNOSIS — S82832A Other fracture of upper and lower end of left fibula, initial encounter for closed fracture: Secondary | ICD-10-CM | POA: Diagnosis present

## 2017-01-05 DIAGNOSIS — S3991XA Unspecified injury of abdomen, initial encounter: Secondary | ICD-10-CM | POA: Diagnosis not present

## 2017-01-05 DIAGNOSIS — R1012 Left upper quadrant pain: Secondary | ICD-10-CM | POA: Diagnosis not present

## 2017-01-05 DIAGNOSIS — M79671 Pain in right foot: Secondary | ICD-10-CM

## 2017-01-05 DIAGNOSIS — S299XXA Unspecified injury of thorax, initial encounter: Secondary | ICD-10-CM | POA: Diagnosis not present

## 2017-01-05 DIAGNOSIS — E2740 Unspecified adrenocortical insufficiency: Secondary | ICD-10-CM | POA: Diagnosis present

## 2017-01-05 DIAGNOSIS — S2242XA Multiple fractures of ribs, left side, initial encounter for closed fracture: Secondary | ICD-10-CM | POA: Diagnosis not present

## 2017-01-05 DIAGNOSIS — I959 Hypotension, unspecified: Secondary | ICD-10-CM | POA: Diagnosis present

## 2017-01-05 DIAGNOSIS — S0990XA Unspecified injury of head, initial encounter: Secondary | ICD-10-CM | POA: Diagnosis not present

## 2017-01-05 DIAGNOSIS — S8992XA Unspecified injury of left lower leg, initial encounter: Secondary | ICD-10-CM | POA: Diagnosis not present

## 2017-01-05 DIAGNOSIS — Z85820 Personal history of malignant melanoma of skin: Secondary | ICD-10-CM | POA: Diagnosis not present

## 2017-01-05 DIAGNOSIS — S2000XA Contusion of breast, unspecified breast, initial encounter: Secondary | ICD-10-CM | POA: Diagnosis present

## 2017-01-05 DIAGNOSIS — S2220XA Unspecified fracture of sternum, initial encounter for closed fracture: Secondary | ICD-10-CM

## 2017-01-05 DIAGNOSIS — M7989 Other specified soft tissue disorders: Secondary | ICD-10-CM | POA: Diagnosis not present

## 2017-01-05 DIAGNOSIS — S20219A Contusion of unspecified front wall of thorax, initial encounter: Secondary | ICD-10-CM | POA: Diagnosis not present

## 2017-01-05 DIAGNOSIS — Y92410 Unspecified street and highway as the place of occurrence of the external cause: Secondary | ICD-10-CM | POA: Diagnosis not present

## 2017-01-05 DIAGNOSIS — R269 Unspecified abnormalities of gait and mobility: Secondary | ICD-10-CM | POA: Diagnosis not present

## 2017-01-05 DIAGNOSIS — D62 Acute posthemorrhagic anemia: Secondary | ICD-10-CM | POA: Diagnosis not present

## 2017-01-05 DIAGNOSIS — S82892A Other fracture of left lower leg, initial encounter for closed fracture: Secondary | ICD-10-CM | POA: Diagnosis not present

## 2017-01-05 DIAGNOSIS — N6489 Other specified disorders of breast: Secondary | ICD-10-CM

## 2017-01-05 DIAGNOSIS — S99912A Unspecified injury of left ankle, initial encounter: Secondary | ICD-10-CM | POA: Diagnosis not present

## 2017-01-05 DIAGNOSIS — S8991XA Unspecified injury of right lower leg, initial encounter: Secondary | ICD-10-CM | POA: Diagnosis not present

## 2017-01-05 DIAGNOSIS — S3993XA Unspecified injury of pelvis, initial encounter: Secondary | ICD-10-CM | POA: Diagnosis not present

## 2017-01-05 DIAGNOSIS — S279XXA Injury of unspecified intrathoracic organ, initial encounter: Secondary | ICD-10-CM | POA: Diagnosis not present

## 2017-01-05 DIAGNOSIS — S2249XA Multiple fractures of ribs, unspecified side, initial encounter for closed fracture: Secondary | ICD-10-CM | POA: Diagnosis present

## 2017-01-05 DIAGNOSIS — M25562 Pain in left knee: Secondary | ICD-10-CM | POA: Diagnosis not present

## 2017-01-05 HISTORY — DX: Unspecified adrenocortical insufficiency: E27.40

## 2017-01-05 LAB — CBC
HCT: 35.5 % — ABNORMAL LOW (ref 36.0–46.0)
Hemoglobin: 11.5 g/dL — ABNORMAL LOW (ref 12.0–15.0)
MCH: 27.7 pg (ref 26.0–34.0)
MCHC: 32.4 g/dL (ref 30.0–36.0)
MCV: 85.5 fL (ref 78.0–100.0)
Platelets: 230 10*3/uL (ref 150–400)
RBC: 4.15 MIL/uL (ref 3.87–5.11)
RDW: 13.7 % (ref 11.5–15.5)
WBC: 15.6 10*3/uL — ABNORMAL HIGH (ref 4.0–10.5)

## 2017-01-05 LAB — COMPREHENSIVE METABOLIC PANEL
ALT: 20 U/L (ref 14–54)
AST: 29 U/L (ref 15–41)
Albumin: 3.3 g/dL — ABNORMAL LOW (ref 3.5–5.0)
Alkaline Phosphatase: 66 U/L (ref 38–126)
Anion gap: 6 (ref 5–15)
BUN: 10 mg/dL (ref 6–20)
CO2: 25 mmol/L (ref 22–32)
Calcium: 8.2 mg/dL — ABNORMAL LOW (ref 8.9–10.3)
Chloride: 105 mmol/L (ref 101–111)
Creatinine, Ser: 0.75 mg/dL (ref 0.44–1.00)
GFR calc Af Amer: >60 mL/min (ref >60)
GFR calc non Af Amer: >60 mL/min (ref >60)
Glucose, Bld: 171 mg/dL — ABNORMAL HIGH (ref 65–99)
Potassium: 3.8 mmol/L (ref 3.5–5.1)
Sodium: 136 mmol/L (ref 135–145)
Total Bilirubin: 0.3 mg/dL (ref 0.3–1.2)
Total Protein: 6.1 g/dL — ABNORMAL LOW (ref 6.5–8.1)

## 2017-01-05 LAB — I-STAT TROPONIN, ED: Troponin i, poc: 0.01 ng/mL (ref 0.00–0.08)

## 2017-01-05 MED ORDER — LEVOTHYROXINE SODIUM 75 MCG PO TABS
37.5000 ug | ORAL_TABLET | Freq: Every day | ORAL | Status: DC
  Administered 2017-01-06 – 2017-01-08 (×3): 37.5 ug via ORAL
  Filled 2017-01-05 (×3): qty 1

## 2017-01-05 MED ORDER — HYDROMORPHONE HCL 1 MG/ML IJ SOLN: 1.0000 mg | INTRAMUSCULAR | Status: DC | PRN

## 2017-01-05 MED ORDER — ONDANSETRON 4 MG PO TBDP: 4.0000 mg | ORAL_TABLET | Freq: Four times a day (QID) | ORAL | Status: DC | PRN

## 2017-01-05 MED ORDER — ONDANSETRON HCL 4 MG/2ML IJ SOLN: 4.0000 mg | Freq: Four times a day (QID) | INTRAMUSCULAR | Status: DC | PRN

## 2017-01-05 MED ORDER — DEXTROSE-NACL 5-0.9 % IV SOLN
INTRAVENOUS | Status: DC
  Administered 2017-01-06: 1000 mL via INTRAVENOUS

## 2017-01-05 MED ORDER — IOPAMIDOL (ISOVUE-300) INJECTION 61%
INTRAVENOUS | Status: AC
  Administered 2017-01-05: 100 mL
  Filled 2017-01-05: qty 100

## 2017-01-05 MED ORDER — EPINEPHRINE 0.15 MG/0.3ML IJ SOAJ: 0.1500 mg | INTRAMUSCULAR | Status: DC | PRN

## 2017-01-05 MED ORDER — HYDROCORTISONE NA SUCCINATE PF 100 MG IJ SOLR
100.0000 mg | Freq: Once | INTRAMUSCULAR | Status: AC
  Administered 2017-01-05: 100 mg via INTRAVENOUS
  Filled 2017-01-05: qty 2

## 2017-01-05 MED ORDER — SODIUM CHLORIDE 0.9 % IV BOLUS (SEPSIS)
1000.0000 mL | Freq: Once | INTRAVENOUS | Status: AC
  Administered 2017-01-05: 1000 mL via INTRAVENOUS

## 2017-01-05 MED ORDER — PREDNISONE 1 MG PO TABS
2.0000 mg | ORAL_TABLET | Freq: Two times a day (BID) | ORAL | Status: DC
  Administered 2017-01-06 – 2017-01-08 (×5): 2 mg via ORAL
  Filled 2017-01-05 (×5): qty 2

## 2017-01-05 MED ORDER — ONDANSETRON HCL 4 MG/2ML IJ SOLN
4.0000 mg | Freq: Once | INTRAMUSCULAR | Status: AC
  Administered 2017-01-05: 4 mg via INTRAVENOUS
  Filled 2017-01-05: qty 2
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
## 2017-01-06 ENCOUNTER — Inpatient Hospital Stay (HOSPITAL_COMMUNITY): Payer: BLUE CROSS/BLUE SHIELD

## 2017-01-06 ENCOUNTER — Encounter (HOSPITAL_COMMUNITY): Payer: Self-pay

## 2017-01-06 LAB — URINALYSIS, ROUTINE W REFLEX MICROSCOPIC
Bacteria, UA: NONE SEEN
Bilirubin Urine: NEGATIVE
Glucose, UA: NEGATIVE mg/dL
Hgb urine dipstick: NEGATIVE
Ketones, ur: NEGATIVE mg/dL
Nitrite: NEGATIVE
Protein, ur: NEGATIVE mg/dL
Specific Gravity, Urine: >1.046 — ABNORMAL HIGH (ref 1.005–1.030)
pH: 6 (ref 5.0–8.0)

## 2017-01-06 LAB — TYPE AND SCREEN
ABO/RH(D): A POS
Antibody Screen: NEGATIVE

## 2017-01-06 LAB — CBC
HCT: 31.8 % — ABNORMAL LOW (ref 36.0–46.0)
Hemoglobin: 10.5 g/dL — ABNORMAL LOW (ref 12.0–15.0)
MCH: 28.2 pg (ref 26.0–34.0)
MCHC: 33 g/dL (ref 30.0–36.0)
MCV: 85.5 fL (ref 78.0–100.0)
Platelets: 202 10*3/uL (ref 150–400)
RBC: 3.72 MIL/uL — ABNORMAL LOW (ref 3.87–5.11)
RDW: 13.6 % (ref 11.5–15.5)
WBC: 13 10*3/uL — ABNORMAL HIGH (ref 4.0–10.5)

## 2017-01-06 LAB — HIV ANTIBODY (ROUTINE TESTING W REFLEX): HIV Screen 4th Generation wRfx: NONREACTIVE

## 2017-01-06 LAB — ABO/RH: ABO/RH(D): A POS

## 2017-01-06 LAB — GLUCOSE, CAPILLARY: Glucose-Capillary: 174 mg/dL — ABNORMAL HIGH (ref 65–99)

## 2017-01-06 MED ORDER — TRAMADOL HCL 50 MG PO TABS
50.0000 mg | ORAL_TABLET | Freq: Four times a day (QID) | ORAL | Status: DC
  Administered 2017-01-06 – 2017-01-08 (×8): 50 mg via ORAL
  Filled 2017-01-06 (×8): qty 1

## 2017-01-06 MED ORDER — ACETAMINOPHEN 500 MG PO TABS
1000.0000 mg | ORAL_TABLET | Freq: Three times a day (TID) | ORAL | Status: DC
Start: 2017-01-06 — End: 2017-01-08
  Administered 2017-01-06 – 2017-01-08 (×7): 1000 mg via ORAL
  Filled 2017-01-06 (×7): qty 2

## 2017-01-06 MED ORDER — METHOCARBAMOL 500 MG PO TABS
500.0000 mg | ORAL_TABLET | Freq: Three times a day (TID) | ORAL | Status: DC
  Administered 2017-01-06: 500 mg via ORAL
  Filled 2017-01-06: qty 1

## 2017-01-06 MED ORDER — SODIUM CHLORIDE 0.9 % IV BOLUS (SEPSIS)
500.0000 mL | Freq: Once | INTRAVENOUS | Status: AC
  Administered 2017-01-06: 500 mL via INTRAVENOUS

## 2017-01-06 MED ORDER — HYDROMORPHONE HCL 1 MG/ML IJ SOLN: 0.5000 mg | INTRAMUSCULAR | Status: DC | PRN

## 2017-01-06 MED ORDER — OXYCODONE HCL 5 MG PO TABS
5.0000 mg | ORAL_TABLET | ORAL | Status: DC | PRN
  Filled 2017-01-06: qty 1

## 2017-01-06 NOTE — ED Notes (Signed)
Pt states that she wanted it put in her chart that she prefers Dr Percell Miller for orthopedic provider.

## 2017-01-06 NOTE — Progress Notes (Signed)
Patient admitted to 3N02. Pt GCS 15 c/o no pain at rest, but with 5/10 pain with movement at ribs. Patient ambulated from stretcher to bed using cam walker boot and roll walker. Patient c/o of 'feeling hot' while on the toilet. Denied dizziness and nausea. CBG 174, other vital signs WNL. See flowsheet. Husband at bedside. IS 1500- patient does well with it.      Mick Sell RN

## 2017-01-06 NOTE — Progress Notes (Signed)
Patient BP dropped with ambulation and continues to be soft, 92/57(69) HR 75 RR 16 O2 sat 97%. MD paged. RN will continue to monitor.

## 2017-01-06 NOTE — Progress Notes (Signed)
Patient states she takes her synthroid at 0630 and prednisone at 0730. RN contacting pharmacy and will continue to monitor.
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
## 2017-01-06 NOTE — Progress Notes (Signed)
500 mL bolus complete for hypotension. Patient instructed to increase activity slowly, use BSC, sit in chair at least for meals and continue to have staff assist due to BP changes. Patient and family v/u. RN will continue to monitor patient.
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
## 2017-01-06 NOTE — Progress Notes (Signed)
Patient off unit for imaging.

## 2017-01-06 NOTE — Progress Notes (Signed)
Orthopedic Tech Progress Note Patient Details:  Carolyn Roberts 07-06-1955 [id number]  Ortho Devices Type of Ortho Device: CAM walker Ortho Device/Splint Location: lle Ortho Device/Splint Interventions: Ordered, Application, Adjustment   Karolee Stamps 01/06/2017, 4:35 AM

## 2017-01-07 LAB — CBC WITH DIFFERENTIAL/PLATELET
Basophils Absolute: 0 10*3/uL (ref 0.0–0.1)
Basophils Absolute: 0 10*3/uL (ref 0.0–0.1)
Basophils Relative: 0 %
Basophils Relative: 0 %
Eosinophils Absolute: 0.1 10*3/uL (ref 0.0–0.7)
Eosinophils Absolute: 0 10*3/uL (ref 0.0–0.7)
Eosinophils Relative: 1 %
Eosinophils Relative: 1 %
HCT: 24.3 % — ABNORMAL LOW (ref 36.0–46.0)
HCT: 28.7 % — ABNORMAL LOW (ref 36.0–46.0)
Hemoglobin: 8.1 g/dL — ABNORMAL LOW (ref 12.0–15.0)
Hemoglobin: 9.3 g/dL — ABNORMAL LOW (ref 12.0–15.0)
Lymphocytes Relative: 38 %
Lymphocytes Relative: 18 %
Lymphs Abs: 2.3 10*3/uL (ref 0.7–4.0)
Lymphs Abs: 1.5 10*3/uL (ref 0.7–4.0)
MCH: 28.6 pg (ref 26.0–34.0)
MCH: 28.1 pg (ref 26.0–34.0)
MCHC: 33.3 g/dL (ref 30.0–36.0)
MCHC: 32.4 g/dL (ref 30.0–36.0)
MCV: 85.9 fL (ref 78.0–100.0)
MCV: 86.7 fL (ref 78.0–100.0)
Monocytes Absolute: 0.7 10*3/uL (ref 0.1–1.0)
Monocytes Absolute: 0.8 10*3/uL (ref 0.1–1.0)
Monocytes Relative: 12 %
Monocytes Relative: 9 %
Neutro Abs: 3.1 10*3/uL (ref 1.7–7.7)
Neutro Abs: 6.1 10*3/uL (ref 1.7–7.7)
Neutrophils Relative %: 50 %
Neutrophils Relative %: 72 %
Platelets: 162 10*3/uL (ref 150–400)
Platelets: 197 10*3/uL (ref 150–400)
RBC: 2.83 MIL/uL — ABNORMAL LOW (ref 3.87–5.11)
RBC: 3.31 MIL/uL — ABNORMAL LOW (ref 3.87–5.11)
RDW: 14.1 % (ref 11.5–15.5)
RDW: 13.9 % (ref 11.5–15.5)
WBC: 6.2 10*3/uL (ref 4.0–10.5)
WBC: 8.5 10*3/uL (ref 4.0–10.5)

## 2017-01-07 MED ORDER — FERROUS SULFATE 325 (65 FE) MG PO TABS
325.0000 mg | ORAL_TABLET | Freq: Two times a day (BID) | ORAL | Status: DC
  Administered 2017-01-07 – 2017-01-08 (×3): 325 mg via ORAL
  Filled 2017-01-07 (×3): qty 1

## 2017-01-07 MED ORDER — ADULT MULTIVITAMIN W/MINERALS CH
1.0000 | ORAL_TABLET | Freq: Every day | ORAL | Status: DC
  Administered 2017-01-07: 1 via ORAL
  Filled 2017-01-07: qty 1

## 2017-01-07 MED ORDER — METHOCARBAMOL 500 MG PO TABS: 500.0000 mg | ORAL_TABLET | Freq: Three times a day (TID) | ORAL | Status: DC | PRN
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
## 2017-01-07 NOTE — Discharge Instructions (Signed)
Rib Fracture ° °A rib fracture is a break or crack in one of the bones of the ribs. The ribs are a group of long, curved bones that wrap around your chest and attach to your spine. They protect your lungs and other organs in the chest cavity. A broken or cracked rib is often painful, but most do not cause other problems. Most rib fractures heal on their own over time. However, rib fractures can be more serious if multiple ribs are broken or if broken ribs move out of place and push against other structures. °What are the causes? °· A direct blow to the chest. For example, this could happen during contact sports, a car accident, or a fall against a hard object. °· Repetitive movements with high force, such as pitching a baseball or having severe coughing spells. °What are the signs or symptoms? °· Pain when you breathe in or cough. °· Pain when someone presses on the injured area. °How is this diagnosed? °Your caregiver will perform a physical exam. Various imaging tests may be ordered to confirm the diagnosis and to look for related injuries. These tests may include a chest X-ray, computed tomography (CT), magnetic resonance imaging (MRI), or a bone scan. °How is this treated? °Rib fractures usually heal on their own in 1-3 months. The longer healing period is often associated with a continued cough or other aggravating activities. During the healing period, pain control is very important. Medication is usually given to control pain. Hospitalization or surgery may be needed for more severe injuries, such as those in which multiple ribs are broken or the ribs have moved out of place. °Follow these instructions at home: °· Avoid strenuous activity and any activities or movements that cause pain. Be careful during activities and avoid bumping the injured rib. °· Gradually increase activity as directed by your caregiver. °· Only take over-the-counter or prescription medications as directed by your caregiver. Do not take  other medications without asking your caregiver first. °· Apply ice to the injured area for the first 1-2 days after you have been treated or as directed by your caregiver. Applying ice helps to reduce inflammation and pain. °? Put ice in a plastic bag. °? Place a towel between your skin and the bag. °? Leave the ice on for 15-20 minutes at a time, every 2 hours while you are awake. °· Perform deep breathing as directed by your caregiver. This will help prevent pneumonia, which is a common complication of a broken rib. Your caregiver may instruct you to: °? Take deep breaths several times a day. °? Try to cough several times a day, holding a pillow against the injured area. °? Use a device called an incentive spirometer to practice deep breathing several times a day. °· Drink enough fluids to keep your urine clear or pale yellow. This will help you avoid constipation. °· Do not wear a rib belt or binder. These restrict breathing, which can lead to pneumonia. °Get help right away if: °· You have a fever. °· You have difficulty breathing or shortness of breath. °· You develop a continual cough, or you cough up thick or bloody sputum. °· You feel sick to your stomach (nausea), throw up (vomit), or have abdominal pain. °· You have worsening pain not controlled with medications. °This information is not intended to replace advice given to you by your health care provider. Make sure you discuss any questions you have with your health care provider. °Document Released: 02/22/2005   Document Revised: 07/31/2015 Document Reviewed: 04/26/2012 °Elsevier Interactive Patient Education © 2018 [employer] ° °

## 2017-01-08 ENCOUNTER — Encounter (HOSPITAL_COMMUNITY): Payer: Self-pay | Admitting: *Deleted

## 2017-01-08 LAB — CBC WITH DIFFERENTIAL/PLATELET
Basophils Absolute: 0 10*3/uL (ref 0.0–0.1)
Basophils Relative: 0 %
Eosinophils Absolute: 0.1 10*3/uL (ref 0.0–0.7)
Eosinophils Relative: 1 %
HCT: 26.6 % — ABNORMAL LOW (ref 36.0–46.0)
Hemoglobin: 8.6 g/dL — ABNORMAL LOW (ref 12.0–15.0)
Lymphocytes Relative: 32 %
Lymphs Abs: 2.1 10*3/uL (ref 0.7–4.0)
MCH: 28 pg (ref 26.0–34.0)
MCHC: 32.3 g/dL (ref 30.0–36.0)
MCV: 86.6 fL (ref 78.0–100.0)
Monocytes Absolute: 0.7 10*3/uL (ref 0.1–1.0)
Monocytes Relative: 10 %
Neutro Abs: 3.6 10*3/uL (ref 1.7–7.7)
Neutrophils Relative %: 57 %
Platelets: 189 10*3/uL (ref 150–400)
RBC: 3.07 MIL/uL — ABNORMAL LOW (ref 3.87–5.11)
RDW: 13.8 % (ref 11.5–15.5)
WBC: 6.4 10*3/uL (ref 4.0–10.5)

## 2017-01-08 MED ORDER — TRAMADOL HCL 50 MG PO TABS: 50.0000 mg | ORAL_TABLET | Freq: Four times a day (QID) | ORAL | 0 refills | Status: AC

## 2017-01-08 MED ORDER — OXYCODONE HCL 5 MG PO TABS: 5.0000 mg | ORAL_TABLET | ORAL | 0 refills | Status: AC | PRN
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
## 2017-01-08 NOTE — Progress Notes (Signed)
Patient discharged to home with husband and 2 daughters. BCS, rolling walker and cam walking boot taken with patient. Discharge instructions given and understood by patient and family.
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
## 2017-01-08 NOTE — Care Management Note (Signed)
Case Management Note  Patient Details  Name: Carolyn Roberts MRN: [id number] Date of Birth: [birth year]  Subjective/Objective:    Made [facility] aware of need for RW and 3n1.                 Action/Plan:CM will sign off for now but will be available should additional discharge needs arise or disposition change.    Expected Discharge Date:                  Expected Discharge Plan:  [place]  In-House Referral:  Clinical Social Work  Discharge planning Services  CM Consult  Post Acute Care Choice:  Durable Medical Equipment Choice offered to:     DME Arranged:  3-N-1, Walker rolling DME Agency:  [place]:    [place]:     Status of Service:  Completed, signed off  If discussed at [place] of Stay Meetings, dates discussed:    Additional Comments:  Delrae Sawyers, RN 01/08/2017, 8:38 AM

## 2017-01-10 DIAGNOSIS — Z1212 Encounter for screening for malignant neoplasm of rectum: Secondary | ICD-10-CM | POA: Diagnosis not present
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
## 2017-01-17 DIAGNOSIS — D649 Anemia, unspecified: Secondary | ICD-10-CM | POA: Diagnosis not present

## 2017-01-18 DIAGNOSIS — S2002XA Contusion of left breast, initial encounter: Secondary | ICD-10-CM | POA: Diagnosis not present

## 2017-01-18 DIAGNOSIS — S2242XA Multiple fractures of ribs, left side, initial encounter for closed fracture: Secondary | ICD-10-CM | POA: Diagnosis not present

## 2017-02-11 DIAGNOSIS — H25813 Combined forms of age-related cataract, bilateral: Secondary | ICD-10-CM | POA: Diagnosis not present

## 2017-02-11 DIAGNOSIS — H04123 Dry eye syndrome of bilateral lacrimal glands: Secondary | ICD-10-CM | POA: Diagnosis not present

## 2017-02-16 DIAGNOSIS — T148XXA Other injury of unspecified body region, initial encounter: Secondary | ICD-10-CM | POA: Diagnosis not present

## 2017-02-16 DIAGNOSIS — D2270 Melanocytic nevi of unspecified lower limb, including hip: Secondary | ICD-10-CM | POA: Diagnosis not present

## 2017-02-16 DIAGNOSIS — L565 Disseminated superficial actinic porokeratosis (DSAP): Secondary | ICD-10-CM | POA: Diagnosis not present

## 2017-02-16 DIAGNOSIS — L570 Actinic keratosis: Secondary | ICD-10-CM | POA: Diagnosis not present

## 2017-02-16 DIAGNOSIS — Z85820 Personal history of malignant melanoma of skin: Secondary | ICD-10-CM | POA: Diagnosis not present

## 2017-02-16 DIAGNOSIS — C799 Secondary malignant neoplasm of unspecified site: Secondary | ICD-10-CM | POA: Diagnosis not present

## 2017-02-16 DIAGNOSIS — X58XXXA Exposure to other specified factors, initial encounter: Secondary | ICD-10-CM | POA: Diagnosis not present

## 2017-02-16 DIAGNOSIS — L820 Inflamed seborrheic keratosis: Secondary | ICD-10-CM | POA: Diagnosis not present

## 2017-02-16 DIAGNOSIS — L814 Other melanin hyperpigmentation: Secondary | ICD-10-CM | POA: Diagnosis not present

## 2017-02-16 DIAGNOSIS — D2260 Melanocytic nevi of unspecified upper limb, including shoulder: Secondary | ICD-10-CM | POA: Diagnosis not present

## 2017-02-16 DIAGNOSIS — D225 Melanocytic nevi of trunk: Secondary | ICD-10-CM | POA: Diagnosis not present

## 2017-02-18 DIAGNOSIS — J069 Acute upper respiratory infection, unspecified: Secondary | ICD-10-CM | POA: Diagnosis not present

## 2017-02-18 DIAGNOSIS — R05 Cough: Secondary | ICD-10-CM | POA: Diagnosis not present

## 2017-02-21 DIAGNOSIS — I10 Essential (primary) hypertension: Secondary | ICD-10-CM | POA: Diagnosis not present

## 2017-02-21 DIAGNOSIS — E039 Hypothyroidism, unspecified: Secondary | ICD-10-CM | POA: Diagnosis not present

## 2017-02-21 DIAGNOSIS — Z7952 Long term (current) use of systemic steroids: Secondary | ICD-10-CM | POA: Diagnosis not present

## 2017-02-21 DIAGNOSIS — Z1212 Encounter for screening for malignant neoplasm of rectum: Secondary | ICD-10-CM | POA: Diagnosis not present

## 2017-02-21 DIAGNOSIS — Z79899 Other long term (current) drug therapy: Secondary | ICD-10-CM | POA: Diagnosis not present

## 2017-02-21 DIAGNOSIS — Z1211 Encounter for screening for malignant neoplasm of colon: Secondary | ICD-10-CM | POA: Diagnosis not present

## 2017-02-21 DIAGNOSIS — Z85820 Personal history of malignant melanoma of skin: Secondary | ICD-10-CM | POA: Diagnosis not present

## 2017-02-23 DIAGNOSIS — Z08 Encounter for follow-up examination after completed treatment for malignant neoplasm: Secondary | ICD-10-CM | POA: Diagnosis not present

## 2017-02-23 DIAGNOSIS — Z85820 Personal history of malignant melanoma of skin: Secondary | ICD-10-CM | POA: Diagnosis not present

## 2017-03-22 DIAGNOSIS — E230 Hypopituitarism: Secondary | ICD-10-CM | POA: Diagnosis not present

## 2017-03-22 DIAGNOSIS — E2749 Other adrenocortical insufficiency: Secondary | ICD-10-CM | POA: Diagnosis not present

## 2017-03-22 DIAGNOSIS — E038 Other specified hypothyroidism: Secondary | ICD-10-CM | POA: Diagnosis not present

## 2017-04-26 DIAGNOSIS — N6320 Unspecified lump in the left breast, unspecified quadrant: Secondary | ICD-10-CM | POA: Diagnosis not present

## 2017-04-26 DIAGNOSIS — Z01419 Encounter for gynecological examination (general) (routine) without abnormal findings: Secondary | ICD-10-CM | POA: Diagnosis not present

## 2017-04-28 DIAGNOSIS — C799 Secondary malignant neoplasm of unspecified site: Secondary | ICD-10-CM | POA: Diagnosis not present

## 2017-04-28 DIAGNOSIS — Z85828 Personal history of other malignant neoplasm of skin: Secondary | ICD-10-CM | POA: Diagnosis not present

## 2017-04-28 DIAGNOSIS — D2270 Melanocytic nevi of unspecified lower limb, including hip: Secondary | ICD-10-CM | POA: Diagnosis not present

## 2017-04-28 DIAGNOSIS — L570 Actinic keratosis: Secondary | ICD-10-CM | POA: Diagnosis not present

## 2017-05-23 DIAGNOSIS — I890 Lymphedema, not elsewhere classified: Secondary | ICD-10-CM | POA: Diagnosis not present

## 2017-05-23 DIAGNOSIS — C773 Secondary and unspecified malignant neoplasm of axilla and upper limb lymph nodes: Secondary | ICD-10-CM | POA: Diagnosis not present

## 2017-05-23 DIAGNOSIS — K719 Toxic liver disease, unspecified: Secondary | ICD-10-CM | POA: Diagnosis not present

## 2017-05-23 DIAGNOSIS — K718 Toxic liver disease with other disorders of liver: Secondary | ICD-10-CM | POA: Diagnosis not present

## 2017-05-23 DIAGNOSIS — E236 Other disorders of pituitary gland: Secondary | ICD-10-CM | POA: Diagnosis not present

## 2017-05-23 DIAGNOSIS — Z9289 Personal history of other medical treatment: Secondary | ICD-10-CM | POA: Diagnosis not present

## 2017-05-23 DIAGNOSIS — K8020 Calculus of gallbladder without cholecystitis without obstruction: Secondary | ICD-10-CM | POA: Diagnosis not present

## 2017-05-23 DIAGNOSIS — T451X5A Adverse effect of antineoplastic and immunosuppressive drugs, initial encounter: Secondary | ICD-10-CM | POA: Diagnosis not present

## 2017-05-23 DIAGNOSIS — C44792 Other specified malignant neoplasm of skin of right lower limb, including hip: Secondary | ICD-10-CM | POA: Diagnosis not present

## 2017-05-23 DIAGNOSIS — C439 Malignant melanoma of skin, unspecified: Secondary | ICD-10-CM | POA: Diagnosis not present

## 2017-05-23 DIAGNOSIS — C779 Secondary and unspecified malignant neoplasm of lymph node, unspecified: Secondary | ICD-10-CM | POA: Diagnosis not present

## 2017-05-23 DIAGNOSIS — Z08 Encounter for follow-up examination after completed treatment for malignant neoplasm: Secondary | ICD-10-CM | POA: Diagnosis not present

## 2017-07-19 DIAGNOSIS — Z85828 Personal history of other malignant neoplasm of skin: Secondary | ICD-10-CM | POA: Diagnosis not present

## 2017-07-19 DIAGNOSIS — L570 Actinic keratosis: Secondary | ICD-10-CM | POA: Diagnosis not present

## 2017-07-19 DIAGNOSIS — C799 Secondary malignant neoplasm of unspecified site: Secondary | ICD-10-CM | POA: Diagnosis not present

## 2017-07-19 DIAGNOSIS — D2270 Melanocytic nevi of unspecified lower limb, including hip: Secondary | ICD-10-CM | POA: Diagnosis not present

## 2017-09-06 DIAGNOSIS — N6489 Other specified disorders of breast: Secondary | ICD-10-CM | POA: Diagnosis not present

## 2017-09-06 DIAGNOSIS — K719 Toxic liver disease, unspecified: Secondary | ICD-10-CM | POA: Diagnosis not present

## 2017-09-06 DIAGNOSIS — Z01419 Encounter for gynecological examination (general) (routine) without abnormal findings: Secondary | ICD-10-CM | POA: Diagnosis not present

## 2017-09-28 DIAGNOSIS — J069 Acute upper respiratory infection, unspecified: Secondary | ICD-10-CM | POA: Diagnosis not present

## 2017-09-28 DIAGNOSIS — I10 Essential (primary) hypertension: Secondary | ICD-10-CM | POA: Diagnosis not present

## 2017-09-28 DIAGNOSIS — R05 Cough: Secondary | ICD-10-CM | POA: Diagnosis not present

## 2017-10-03 DIAGNOSIS — J069 Acute upper respiratory infection, unspecified: Secondary | ICD-10-CM | POA: Diagnosis not present

## 2017-10-03 DIAGNOSIS — R05 Cough: Secondary | ICD-10-CM | POA: Diagnosis not present

## 2017-10-03 DIAGNOSIS — Z6828 Body mass index (BMI) 28.0-28.9, adult: Secondary | ICD-10-CM | POA: Diagnosis not present

## 2017-10-03 DIAGNOSIS — I10 Essential (primary) hypertension: Secondary | ICD-10-CM | POA: Diagnosis not present

## 2017-10-20 DIAGNOSIS — E038 Other specified hypothyroidism: Secondary | ICD-10-CM | POA: Diagnosis not present

## 2017-10-20 DIAGNOSIS — E559 Vitamin D deficiency, unspecified: Secondary | ICD-10-CM | POA: Diagnosis not present

## 2017-10-20 DIAGNOSIS — E2749 Other adrenocortical insufficiency: Secondary | ICD-10-CM | POA: Diagnosis not present

## 2017-10-20 DIAGNOSIS — E230 Hypopituitarism: Secondary | ICD-10-CM | POA: Diagnosis not present

## 2017-10-25 DIAGNOSIS — C799 Secondary malignant neoplasm of unspecified site: Secondary | ICD-10-CM | POA: Diagnosis not present

## 2017-10-25 DIAGNOSIS — Z9289 Personal history of other medical treatment: Secondary | ICD-10-CM | POA: Diagnosis not present

## 2017-10-25 DIAGNOSIS — R229 Localized swelling, mass and lump, unspecified: Secondary | ICD-10-CM | POA: Diagnosis not present

## 2017-10-25 DIAGNOSIS — K719 Toxic liver disease, unspecified: Secondary | ICD-10-CM | POA: Diagnosis not present

## 2017-10-25 DIAGNOSIS — L608 Other nail disorders: Secondary | ICD-10-CM | POA: Diagnosis not present

## 2017-11-02 DIAGNOSIS — R2241 Localized swelling, mass and lump, right lower limb: Secondary | ICD-10-CM | POA: Diagnosis not present

## 2017-11-02 DIAGNOSIS — Z85820 Personal history of malignant melanoma of skin: Secondary | ICD-10-CM | POA: Diagnosis not present

## 2017-11-08 DIAGNOSIS — C799 Secondary malignant neoplasm of unspecified site: Secondary | ICD-10-CM | POA: Diagnosis not present

## 2017-11-08 DIAGNOSIS — L608 Other nail disorders: Secondary | ICD-10-CM | POA: Diagnosis not present

## 2017-11-09 DIAGNOSIS — L608 Other nail disorders: Secondary | ICD-10-CM | POA: Diagnosis not present

## 2017-11-09 DIAGNOSIS — L821 Other seborrheic keratosis: Secondary | ICD-10-CM | POA: Diagnosis not present

## 2017-11-09 DIAGNOSIS — D692 Other nonthrombocytopenic purpura: Secondary | ICD-10-CM | POA: Diagnosis not present

## 2017-11-21 DIAGNOSIS — K719 Toxic liver disease, unspecified: Secondary | ICD-10-CM | POA: Diagnosis not present

## 2017-11-21 DIAGNOSIS — Z9289 Personal history of other medical treatment: Secondary | ICD-10-CM | POA: Diagnosis not present

## 2017-11-21 DIAGNOSIS — C801 Malignant (primary) neoplasm, unspecified: Secondary | ICD-10-CM | POA: Diagnosis not present

## 2017-11-21 DIAGNOSIS — C779 Secondary and unspecified malignant neoplasm of lymph node, unspecified: Secondary | ICD-10-CM | POA: Diagnosis not present

## 2017-11-21 DIAGNOSIS — I890 Lymphedema, not elsewhere classified: Secondary | ICD-10-CM | POA: Diagnosis not present

## 2017-11-21 DIAGNOSIS — C439 Malignant melanoma of skin, unspecified: Secondary | ICD-10-CM | POA: Diagnosis not present

## 2017-12-12 ENCOUNTER — Ambulatory Visit
Admission: RE | Admit: 2017-12-12 | Discharge: 2017-12-12 | Disposition: A | Discharge status: Home / Self Care | Payer: BLUE CROSS/BLUE SHIELD | Source: Ambulatory Visit | Attending: Sports Medicine | Admitting: Sports Medicine

## 2017-12-12 ENCOUNTER — Other Ambulatory Visit: Payer: Self-pay | Admitting: Sports Medicine

## 2017-12-12 DIAGNOSIS — M25531 Pain in right wrist: Secondary | ICD-10-CM

## 2017-12-12 DIAGNOSIS — S52501A Unspecified fracture of the lower end of right radius, initial encounter for closed fracture: Secondary | ICD-10-CM | POA: Diagnosis not present

## 2017-12-14 ENCOUNTER — Other Ambulatory Visit: Payer: BLUE CROSS/BLUE SHIELD

## 2017-12-14 DIAGNOSIS — Z23 Encounter for immunization: Secondary | ICD-10-CM | POA: Diagnosis not present

## 2017-12-14 DIAGNOSIS — S52501D Unspecified fracture of the lower end of right radius, subsequent encounter for closed fracture with routine healing: Secondary | ICD-10-CM | POA: Diagnosis not present

## 2017-12-21 DIAGNOSIS — S52501D Unspecified fracture of the lower end of right radius, subsequent encounter for closed fracture with routine healing: Secondary | ICD-10-CM | POA: Diagnosis not present

## 2018-01-03 DIAGNOSIS — M8589 Other specified disorders of bone density and structure, multiple sites: Secondary | ICD-10-CM | POA: Diagnosis not present

## 2018-01-03 DIAGNOSIS — Z7952 Long term (current) use of systemic steroids: Secondary | ICD-10-CM | POA: Diagnosis not present

## 2018-01-03 DIAGNOSIS — S52501D Unspecified fracture of the lower end of right radius, subsequent encounter for closed fracture with routine healing: Secondary | ICD-10-CM | POA: Diagnosis not present

## 2018-01-03 DIAGNOSIS — Z780 Asymptomatic menopausal state: Secondary | ICD-10-CM | POA: Diagnosis not present

## 2018-01-09 DIAGNOSIS — R82998 Other abnormal findings in urine: Secondary | ICD-10-CM | POA: Diagnosis not present

## 2018-01-09 DIAGNOSIS — R7301 Impaired fasting glucose: Secondary | ICD-10-CM | POA: Diagnosis not present

## 2018-01-09 DIAGNOSIS — I10 Essential (primary) hypertension: Secondary | ICD-10-CM | POA: Diagnosis not present

## 2018-01-09 DIAGNOSIS — Z0000 Encounter for general adult medical examination without abnormal findings: Secondary | ICD-10-CM | POA: Diagnosis not present

## 2018-01-09 DIAGNOSIS — M859 Disorder of bone density and structure, unspecified: Secondary | ICD-10-CM | POA: Diagnosis not present

## 2018-01-09 DIAGNOSIS — E038 Other specified hypothyroidism: Secondary | ICD-10-CM | POA: Diagnosis not present

## 2018-01-16 DIAGNOSIS — Z1389 Encounter for screening for other disorder: Secondary | ICD-10-CM | POA: Diagnosis not present

## 2018-01-16 DIAGNOSIS — E230 Hypopituitarism: Secondary | ICD-10-CM | POA: Diagnosis not present

## 2018-01-16 DIAGNOSIS — E038 Other specified hypothyroidism: Secondary | ICD-10-CM | POA: Diagnosis not present

## 2018-01-16 DIAGNOSIS — R7301 Impaired fasting glucose: Secondary | ICD-10-CM | POA: Diagnosis not present

## 2018-01-16 DIAGNOSIS — Z0000 Encounter for general adult medical examination without abnormal findings: Secondary | ICD-10-CM | POA: Diagnosis not present

## 2018-01-16 DIAGNOSIS — I10 Essential (primary) hypertension: Secondary | ICD-10-CM | POA: Diagnosis not present

## 2018-01-20 DIAGNOSIS — Z1212 Encounter for screening for malignant neoplasm of rectum: Secondary | ICD-10-CM | POA: Diagnosis not present

## 2018-01-23 DIAGNOSIS — L821 Other seborrheic keratosis: Secondary | ICD-10-CM | POA: Diagnosis not present

## 2018-01-23 DIAGNOSIS — D1801 Hemangioma of skin and subcutaneous tissue: Secondary | ICD-10-CM | POA: Diagnosis not present

## 2018-01-23 DIAGNOSIS — L814 Other melanin hyperpigmentation: Secondary | ICD-10-CM | POA: Diagnosis not present

## 2018-01-23 DIAGNOSIS — D2270 Melanocytic nevi of unspecified lower limb, including hip: Secondary | ICD-10-CM | POA: Diagnosis not present

## 2018-01-23 DIAGNOSIS — L570 Actinic keratosis: Secondary | ICD-10-CM | POA: Diagnosis not present

## 2018-01-24 DIAGNOSIS — S52501D Unspecified fracture of the lower end of right radius, subsequent encounter for closed fracture with routine healing: Secondary | ICD-10-CM | POA: Diagnosis not present

## 2018-02-06 DIAGNOSIS — E2749 Other adrenocortical insufficiency: Secondary | ICD-10-CM | POA: Diagnosis not present

## 2018-02-06 DIAGNOSIS — E230 Hypopituitarism: Secondary | ICD-10-CM | POA: Diagnosis not present

## 2018-02-06 DIAGNOSIS — E038 Other specified hypothyroidism: Secondary | ICD-10-CM | POA: Diagnosis not present

## 2018-02-16 DIAGNOSIS — K219 Gastro-esophageal reflux disease without esophagitis: Secondary | ICD-10-CM | POA: Diagnosis not present

## 2018-02-16 DIAGNOSIS — M810 Age-related osteoporosis without current pathological fracture: Secondary | ICD-10-CM | POA: Diagnosis not present

## 2018-02-16 DIAGNOSIS — I10 Essential (primary) hypertension: Secondary | ICD-10-CM | POA: Diagnosis not present

## 2018-03-09 DIAGNOSIS — N6489 Other specified disorders of breast: Secondary | ICD-10-CM | POA: Diagnosis not present

## 2018-03-09 DIAGNOSIS — N6320 Unspecified lump in the left breast, unspecified quadrant: Secondary | ICD-10-CM | POA: Diagnosis not present

## 2018-05-04 DIAGNOSIS — J069 Acute upper respiratory infection, unspecified: Secondary | ICD-10-CM | POA: Diagnosis not present

## 2018-05-04 DIAGNOSIS — Z6827 Body mass index (BMI) 27.0-27.9, adult: Secondary | ICD-10-CM | POA: Diagnosis not present

## 2018-05-24 DIAGNOSIS — C439 Malignant melanoma of skin, unspecified: Secondary | ICD-10-CM | POA: Diagnosis not present

## 2018-05-24 DIAGNOSIS — E236 Other disorders of pituitary gland: Secondary | ICD-10-CM | POA: Diagnosis not present

## 2018-05-24 DIAGNOSIS — Z8719 Personal history of other diseases of the digestive system: Secondary | ICD-10-CM | POA: Diagnosis not present

## 2018-05-24 DIAGNOSIS — E038 Other specified hypothyroidism: Secondary | ICD-10-CM | POA: Diagnosis not present

## 2018-05-24 DIAGNOSIS — Z85820 Personal history of malignant melanoma of skin: Secondary | ICD-10-CM | POA: Diagnosis not present

## 2018-05-24 DIAGNOSIS — C779 Secondary and unspecified malignant neoplasm of lymph node, unspecified: Secondary | ICD-10-CM | POA: Diagnosis not present

## 2018-05-24 DIAGNOSIS — Z7952 Long term (current) use of systemic steroids: Secondary | ICD-10-CM | POA: Diagnosis not present

## 2018-05-24 DIAGNOSIS — Z9289 Personal history of other medical treatment: Secondary | ICD-10-CM | POA: Diagnosis not present

## 2018-05-24 DIAGNOSIS — Z08 Encounter for follow-up examination after completed treatment for malignant neoplasm: Secondary | ICD-10-CM | POA: Diagnosis not present

## 2018-05-24 DIAGNOSIS — R918 Other nonspecific abnormal finding of lung field: Secondary | ICD-10-CM | POA: Diagnosis not present

## 2018-05-24 DIAGNOSIS — Z9225 Personal history of immunosupression therapy: Secondary | ICD-10-CM | POA: Diagnosis not present

## 2018-05-24 DIAGNOSIS — Z79899 Other long term (current) drug therapy: Secondary | ICD-10-CM | POA: Diagnosis not present

## 2018-07-10 DIAGNOSIS — H10411 Chronic giant papillary conjunctivitis, right eye: Secondary | ICD-10-CM | POA: Diagnosis not present

## 2018-07-24 DIAGNOSIS — H10411 Chronic giant papillary conjunctivitis, right eye: Secondary | ICD-10-CM | POA: Diagnosis not present

## 2018-07-24 DIAGNOSIS — H5201 Hypermetropia, right eye: Secondary | ICD-10-CM | POA: Diagnosis not present

## 2018-07-25 DIAGNOSIS — L570 Actinic keratosis: Secondary | ICD-10-CM | POA: Diagnosis not present

## 2018-07-25 DIAGNOSIS — C799 Secondary malignant neoplasm of unspecified site: Secondary | ICD-10-CM | POA: Diagnosis not present

## 2018-07-25 DIAGNOSIS — Z85828 Personal history of other malignant neoplasm of skin: Secondary | ICD-10-CM | POA: Diagnosis not present

## 2018-07-25 DIAGNOSIS — B351 Tinea unguium: Secondary | ICD-10-CM | POA: Diagnosis not present

## 2018-07-25 DIAGNOSIS — D485 Neoplasm of uncertain behavior of skin: Secondary | ICD-10-CM | POA: Diagnosis not present

## 2018-08-24 DIAGNOSIS — C439 Malignant melanoma of skin, unspecified: Secondary | ICD-10-CM | POA: Diagnosis not present

## 2018-08-24 DIAGNOSIS — C779 Secondary and unspecified malignant neoplasm of lymph node, unspecified: Secondary | ICD-10-CM | POA: Diagnosis not present

## 2018-08-24 DIAGNOSIS — R918 Other nonspecific abnormal finding of lung field: Secondary | ICD-10-CM | POA: Diagnosis not present
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
## 2018-11-24 DIAGNOSIS — Z23 Encounter for immunization: Secondary | ICD-10-CM | POA: Diagnosis not present

## 2018-11-24 DIAGNOSIS — E2749 Other adrenocortical insufficiency: Secondary | ICD-10-CM | POA: Diagnosis not present

## 2018-11-24 DIAGNOSIS — E230 Hypopituitarism: Secondary | ICD-10-CM | POA: Diagnosis not present

## 2018-11-28 DIAGNOSIS — L821 Other seborrheic keratosis: Secondary | ICD-10-CM | POA: Diagnosis not present

## 2018-11-28 DIAGNOSIS — D2270 Melanocytic nevi of unspecified lower limb, including hip: Secondary | ICD-10-CM | POA: Diagnosis not present

## 2018-11-28 DIAGNOSIS — C799 Secondary malignant neoplasm of unspecified site: Secondary | ICD-10-CM | POA: Diagnosis not present

## 2018-11-28 DIAGNOSIS — Z85828 Personal history of other malignant neoplasm of skin: Secondary | ICD-10-CM | POA: Diagnosis not present

## 2019-01-15 DIAGNOSIS — E038 Other specified hypothyroidism: Secondary | ICD-10-CM | POA: Diagnosis not present

## 2019-01-15 DIAGNOSIS — Z0000 Encounter for general adult medical examination without abnormal findings: Secondary | ICD-10-CM | POA: Diagnosis not present

## 2019-01-15 DIAGNOSIS — I10 Essential (primary) hypertension: Secondary | ICD-10-CM | POA: Diagnosis not present

## 2019-01-15 DIAGNOSIS — E559 Vitamin D deficiency, unspecified: Secondary | ICD-10-CM | POA: Diagnosis not present

## 2019-01-15 DIAGNOSIS — R7301 Impaired fasting glucose: Secondary | ICD-10-CM | POA: Diagnosis not present

## 2019-01-24 DIAGNOSIS — E038 Other specified hypothyroidism: Secondary | ICD-10-CM | POA: Diagnosis not present

## 2019-01-24 DIAGNOSIS — E230 Hypopituitarism: Secondary | ICD-10-CM | POA: Diagnosis not present

## 2019-01-24 DIAGNOSIS — C439 Malignant melanoma of skin, unspecified: Secondary | ICD-10-CM | POA: Diagnosis not present

## 2019-01-24 DIAGNOSIS — R911 Solitary pulmonary nodule: Secondary | ICD-10-CM | POA: Diagnosis not present

## 2019-01-24 DIAGNOSIS — C779 Secondary and unspecified malignant neoplasm of lymph node, unspecified: Secondary | ICD-10-CM | POA: Diagnosis not present

## 2019-01-24 DIAGNOSIS — K719 Toxic liver disease, unspecified: Secondary | ICD-10-CM | POA: Diagnosis not present

## 2019-01-24 DIAGNOSIS — E2749 Other adrenocortical insufficiency: Secondary | ICD-10-CM | POA: Diagnosis not present

## 2019-01-25 DIAGNOSIS — R7301 Impaired fasting glucose: Secondary | ICD-10-CM | POA: Diagnosis not present

## 2019-01-25 DIAGNOSIS — E039 Hypothyroidism, unspecified: Secondary | ICD-10-CM | POA: Diagnosis not present

## 2019-01-25 DIAGNOSIS — Z1331 Encounter for screening for depression: Secondary | ICD-10-CM | POA: Diagnosis not present

## 2019-01-25 DIAGNOSIS — E230 Hypopituitarism: Secondary | ICD-10-CM | POA: Diagnosis not present

## 2019-01-25 DIAGNOSIS — R82998 Other abnormal findings in urine: Secondary | ICD-10-CM | POA: Diagnosis not present

## 2019-01-25 DIAGNOSIS — I10 Essential (primary) hypertension: Secondary | ICD-10-CM | POA: Diagnosis not present

## 2019-01-25 DIAGNOSIS — Z0000 Encounter for general adult medical examination without abnormal findings: Secondary | ICD-10-CM | POA: Diagnosis not present

## 2019-03-01 DIAGNOSIS — Z1212 Encounter for screening for malignant neoplasm of rectum: Secondary | ICD-10-CM | POA: Diagnosis not present

## 2019-03-22 DIAGNOSIS — Z1239 Encounter for other screening for malignant neoplasm of breast: Secondary | ICD-10-CM | POA: Diagnosis not present

## 2019-03-22 DIAGNOSIS — Z1231 Encounter for screening mammogram for malignant neoplasm of breast: Secondary | ICD-10-CM | POA: Diagnosis not present

## 2019-04-03 DIAGNOSIS — C799 Secondary malignant neoplasm of unspecified site: Secondary | ICD-10-CM | POA: Diagnosis not present

## 2019-04-03 DIAGNOSIS — L570 Actinic keratosis: Secondary | ICD-10-CM | POA: Diagnosis not present

## 2019-04-03 DIAGNOSIS — Z85828 Personal history of other malignant neoplasm of skin: Secondary | ICD-10-CM | POA: Diagnosis not present

## 2019-04-03 DIAGNOSIS — L820 Inflamed seborrheic keratosis: Secondary | ICD-10-CM | POA: Diagnosis not present

## 2019-04-13 DIAGNOSIS — I10 Essential (primary) hypertension: Secondary | ICD-10-CM | POA: Diagnosis not present

## 2019-04-21 ENCOUNTER — Ambulatory Visit: Payer: Self-pay

## 2019-06-07 DIAGNOSIS — D259 Leiomyoma of uterus, unspecified: Secondary | ICD-10-CM | POA: Diagnosis not present

## 2019-06-07 DIAGNOSIS — K8020 Calculus of gallbladder without cholecystitis without obstruction: Secondary | ICD-10-CM | POA: Diagnosis not present

## 2019-06-07 DIAGNOSIS — N132 Hydronephrosis with renal and ureteral calculous obstruction: Secondary | ICD-10-CM | POA: Diagnosis not present

## 2019-06-07 DIAGNOSIS — R102 Pelvic and perineal pain: Secondary | ICD-10-CM | POA: Diagnosis not present

## 2019-06-07 DIAGNOSIS — R350 Frequency of micturition: Secondary | ICD-10-CM | POA: Diagnosis not present

## 2019-06-07 DIAGNOSIS — Z87442 Personal history of urinary calculi: Secondary | ICD-10-CM | POA: Diagnosis not present

## 2019-06-07 DIAGNOSIS — N1330 Unspecified hydronephrosis: Secondary | ICD-10-CM | POA: Diagnosis not present

## 2019-06-07 DIAGNOSIS — R3129 Other microscopic hematuria: Secondary | ICD-10-CM | POA: Diagnosis not present

## 2019-07-17 DIAGNOSIS — D485 Neoplasm of uncertain behavior of skin: Secondary | ICD-10-CM | POA: Diagnosis not present

## 2019-07-17 DIAGNOSIS — Z85828 Personal history of other malignant neoplasm of skin: Secondary | ICD-10-CM | POA: Diagnosis not present

## 2019-07-17 DIAGNOSIS — L820 Inflamed seborrheic keratosis: Secondary | ICD-10-CM | POA: Diagnosis not present

## 2019-07-17 DIAGNOSIS — C799 Secondary malignant neoplasm of unspecified site: Secondary | ICD-10-CM | POA: Diagnosis not present

## 2019-07-17 DIAGNOSIS — L570 Actinic keratosis: Secondary | ICD-10-CM | POA: Diagnosis not present

## 2019-08-09 DIAGNOSIS — L578 Other skin changes due to chronic exposure to nonionizing radiation: Secondary | ICD-10-CM | POA: Diagnosis not present

## 2019-08-09 DIAGNOSIS — C44729 Squamous cell carcinoma of skin of left lower limb, including hip: Secondary | ICD-10-CM | POA: Diagnosis not present

## 2019-08-09 DIAGNOSIS — L570 Actinic keratosis: Secondary | ICD-10-CM | POA: Diagnosis not present

## 2019-08-09 DIAGNOSIS — L820 Inflamed seborrheic keratosis: Secondary | ICD-10-CM | POA: Diagnosis not present

## 2019-11-20 DIAGNOSIS — L570 Actinic keratosis: Secondary | ICD-10-CM | POA: Diagnosis not present

## 2019-11-20 DIAGNOSIS — C799 Secondary malignant neoplasm of unspecified site: Secondary | ICD-10-CM | POA: Diagnosis not present

## 2019-11-20 DIAGNOSIS — D2260 Melanocytic nevi of unspecified upper limb, including shoulder: Secondary | ICD-10-CM | POA: Diagnosis not present

## 2019-11-20 DIAGNOSIS — Z85828 Personal history of other malignant neoplasm of skin: Secondary | ICD-10-CM | POA: Diagnosis not present

## 2019-11-26 DIAGNOSIS — Z20828 Contact with and (suspected) exposure to other viral communicable diseases: Secondary | ICD-10-CM | POA: Diagnosis not present

## 2019-11-30 DIAGNOSIS — Z7952 Long term (current) use of systemic steroids: Secondary | ICD-10-CM | POA: Diagnosis not present

## 2019-11-30 DIAGNOSIS — E038 Other specified hypothyroidism: Secondary | ICD-10-CM | POA: Diagnosis not present

## 2019-11-30 DIAGNOSIS — E2749 Other adrenocortical insufficiency: Secondary | ICD-10-CM | POA: Diagnosis not present

## 2019-11-30 DIAGNOSIS — Z23 Encounter for immunization: Secondary | ICD-10-CM | POA: Diagnosis not present

## 2019-11-30 DIAGNOSIS — E2740 Unspecified adrenocortical insufficiency: Secondary | ICD-10-CM | POA: Diagnosis not present

## 2019-12-04 DIAGNOSIS — E039 Hypothyroidism, unspecified: Secondary | ICD-10-CM | POA: Diagnosis not present

## 2019-12-04 DIAGNOSIS — E2740 Unspecified adrenocortical insufficiency: Secondary | ICD-10-CM | POA: Diagnosis not present

## 2020-01-14 DIAGNOSIS — H25813 Combined forms of age-related cataract, bilateral: Secondary | ICD-10-CM | POA: Diagnosis not present

## 2020-01-14 DIAGNOSIS — H04123 Dry eye syndrome of bilateral lacrimal glands: Secondary | ICD-10-CM | POA: Diagnosis not present

## 2020-01-17 ENCOUNTER — Other Ambulatory Visit: Payer: Self-pay | Admitting: Internal Medicine

## 2020-01-17 DIAGNOSIS — M810 Age-related osteoporosis without current pathological fracture: Secondary | ICD-10-CM

## 2020-01-17 DIAGNOSIS — M8589 Other specified disorders of bone density and structure, multiple sites: Secondary | ICD-10-CM | POA: Diagnosis not present

## 2020-01-17 DIAGNOSIS — Z780 Asymptomatic menopausal state: Secondary | ICD-10-CM | POA: Diagnosis not present

## 2020-01-22 DIAGNOSIS — C439 Malignant melanoma of skin, unspecified: Secondary | ICD-10-CM | POA: Diagnosis not present

## 2020-01-22 DIAGNOSIS — E038 Other specified hypothyroidism: Secondary | ICD-10-CM | POA: Diagnosis not present

## 2020-01-22 DIAGNOSIS — E2740 Unspecified adrenocortical insufficiency: Secondary | ICD-10-CM | POA: Diagnosis not present

## 2020-01-22 DIAGNOSIS — E230 Hypopituitarism: Secondary | ICD-10-CM | POA: Diagnosis not present

## 2020-01-22 DIAGNOSIS — C779 Secondary and unspecified malignant neoplasm of lymph node, unspecified: Secondary | ICD-10-CM | POA: Diagnosis not present

## 2020-01-22 DIAGNOSIS — Z85820 Personal history of malignant melanoma of skin: Secondary | ICD-10-CM | POA: Diagnosis not present

## 2020-01-22 DIAGNOSIS — R911 Solitary pulmonary nodule: Secondary | ICD-10-CM | POA: Diagnosis not present

## 2020-01-25 DIAGNOSIS — M810 Age-related osteoporosis without current pathological fracture: Secondary | ICD-10-CM | POA: Diagnosis not present

## 2020-01-25 DIAGNOSIS — Z0000 Encounter for general adult medical examination without abnormal findings: Secondary | ICD-10-CM | POA: Diagnosis not present

## 2020-01-25 DIAGNOSIS — R7301 Impaired fasting glucose: Secondary | ICD-10-CM | POA: Diagnosis not present

## 2020-01-25 DIAGNOSIS — E039 Hypothyroidism, unspecified: Secondary | ICD-10-CM | POA: Diagnosis not present

## 2020-01-28 DIAGNOSIS — H25813 Combined forms of age-related cataract, bilateral: Secondary | ICD-10-CM | POA: Diagnosis not present

## 2020-02-01 DIAGNOSIS — Z0000 Encounter for general adult medical examination without abnormal findings: Secondary | ICD-10-CM | POA: Diagnosis not present

## 2020-02-01 DIAGNOSIS — Z1331 Encounter for screening for depression: Secondary | ICD-10-CM | POA: Diagnosis not present

## 2020-02-01 DIAGNOSIS — I10 Essential (primary) hypertension: Secondary | ICD-10-CM | POA: Diagnosis not present

## 2020-02-06 DIAGNOSIS — Z01419 Encounter for gynecological examination (general) (routine) without abnormal findings: Secondary | ICD-10-CM | POA: Diagnosis not present

## 2020-02-19 DIAGNOSIS — Z85828 Personal history of other malignant neoplasm of skin: Secondary | ICD-10-CM | POA: Diagnosis not present

## 2020-02-19 DIAGNOSIS — L578 Other skin changes due to chronic exposure to nonionizing radiation: Secondary | ICD-10-CM | POA: Diagnosis not present

## 2020-02-19 DIAGNOSIS — Z85820 Personal history of malignant melanoma of skin: Secondary | ICD-10-CM | POA: Diagnosis not present

## 2020-02-19 DIAGNOSIS — L570 Actinic keratosis: Secondary | ICD-10-CM | POA: Diagnosis not present

## 2020-04-04 ENCOUNTER — Other Ambulatory Visit: Payer: Self-pay | Admitting: Infectious Diseases

## 2020-04-04 ENCOUNTER — Telehealth: Payer: Self-pay | Admitting: Infectious Diseases

## 2020-04-04 DIAGNOSIS — U071 COVID-19: Secondary | ICD-10-CM

## 2020-04-04 NOTE — Telephone Encounter (Signed)
Called to discuss with patient about COVID-19 symptoms and the use of one of the available treatments for those with mild to moderate Covid symptoms and at a high risk of hospitalization.  Pt appears to qualify for outpatient treatment due to co-morbid conditions and/or a member of an at-risk group in accordance with the FDA Emergency Use Authorization.    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 Yes - adrenal insufficiency  Qualifiers: adrenal insufficiency, BMI > 25  Unable to schedule paxlovid d/t drug interactions.  She would prefer most efficacious treatment which is IV remdesivir x 3 days.  Discussed with he rover the phone including cost.    Janene Madeira, NP

## 2020-04-04 NOTE — Progress Notes (Signed)
I connected by phone with Carolyn Roberts on 04/04/2020 at 2:27 PM to discuss the potential use of the antiviral REMDESIVIR for acute COVID-19 viral infection in non-hospitalized patients.   This patient is a 65 y.o. female that meets the FDA criteria for Emergency Use Authorization of [place].  Has a (+) direct SARS-CoV-2 viral test result  Has mild or moderate symptoms related to COVID-19  Is within 7 days of symptom onset  Has at least one of the high risk factor(s) for progression to severe COVID-19 and/or hospitalization as defined in NIH Guidelines and EUA.   Specific high risk criteria : BMI > 25 and Immunosuppressive Disease or Treatment   I have spoken and communicated the following to the patient or parent/caregiver regarding COVID-19 IV Antiviral treatment:  1. FDA has authorized the emergency use for the treatment of mild to moderate COVID-19 in adults and pediatric patients with positive results of SARS-CoV-2 testing who are 12 years of age and older weighing at least 40 kg, and who are at high risk for progressing to severe COVID-19 and/or hospitalization.  2. The significant known and potential risks and benefits in receiving REMDESIVIR in accordance with current NIH treatment guidelines.   3. Information on available alternative treatments and the risks and benefits of those alternatives, including clinical trials that may be accessible to the patient.   4. Patients treated with antiviral therapy should continue to isolate and use infection control measures (e.g., wear mask, isolate, social distance, avoid sharing personal items, clean and disinfect "high touch" surfaces, and frequent handwashing) according to CDC guidelines.   5. The patient or parent/caregiver has the option to accept or refuse REMDESIVIR therapy and has had the opportunity to have all questions addressed prior to consenting for treatment.    After reviewing this information with the patient, he/she has  decided to proceed with the 3 day course of treatment.   Janene Madeira, NP 04/04/2020  2:27 PM

## 2020-04-05 ENCOUNTER — Ambulatory Visit (HOSPITAL_COMMUNITY): Payer: BLUE CROSS/BLUE SHIELD

## 2020-04-05 ENCOUNTER — Ambulatory Visit (HOSPITAL_COMMUNITY)
Admission: RE | Admit: 2020-04-05 | Discharge: 2020-04-05 | Disposition: A | Discharge status: Home / Self Care | Payer: 59 | Source: Ambulatory Visit | Attending: Pulmonary Disease | Admitting: Pulmonary Disease

## 2020-04-05 DIAGNOSIS — U071 COVID-19: Secondary | ICD-10-CM | POA: Insufficient documentation

## 2020-04-05 MED ORDER — SODIUM CHLORIDE 0.9 % IV SOLN
100.0000 mg | Freq: Once | INTRAVENOUS | Status: AC
  Administered 2020-04-05: 100 mg via INTRAVENOUS

## 2020-04-05 MED ORDER — DIPHENHYDRAMINE HCL 50 MG/ML IJ SOLN: 50.0000 mg | Freq: Once | INTRAMUSCULAR | Status: DC | PRN

## 2020-04-05 MED ORDER — EPINEPHRINE 0.3 MG/0.3ML IJ SOAJ: 0.3000 mg | Freq: Once | INTRAMUSCULAR | Status: DC | PRN

## 2020-04-05 MED ORDER — FAMOTIDINE IN NACL 20-0.9 MG/50ML-% IV SOLN: 20.0000 mg | Freq: Once | INTRAVENOUS | Status: DC | PRN

## 2020-04-05 MED ORDER — METHYLPREDNISOLONE SODIUM SUCC 125 MG IJ SOLR: 125.0000 mg | Freq: Once | INTRAMUSCULAR | Status: DC | PRN

## 2020-04-05 MED ORDER — SODIUM CHLORIDE 0.9 % IV SOLN: INTRAVENOUS | Status: DC | PRN

## 2020-04-05 MED ORDER — ALBUTEROL SULFATE HFA 108 (90 BASE) MCG/ACT IN AERS: 2.0000 | INHALATION_SPRAY | Freq: Once | RESPIRATORY_TRACT | Status: DC | PRN

## 2020-04-05 NOTE — Discharge Instructions (Signed)
10 Things You Can Do to Manage Your COVID-19 Symptoms at Home °If you have possible or confirmed COVID-19: °1. Stay home except to get medical care. °2. Monitor your symptoms carefully. If your symptoms get worse, call your healthcare provider immediately. °3. Get rest and stay hydrated. °4. If you have a medical appointment, call the healthcare provider ahead of time and tell them that you have or may have COVID-19. °5. For medical emergencies, call 911 and notify the dispatch personnel that you have or may have COVID-19. °6. Cover your cough and sneezes with a tissue or use the inside of your elbow. °7. Wash your hands often with soap and water for at least 20 seconds or clean your hands with an alcohol-based hand sanitizer that contains at least 60% alcohol. °8. As much as possible, stay in a specific room and away from other people in your home. Also, you should use a separate bathroom, if available. If you need to be around other people in or outside of the home, wear a mask. °9. Avoid sharing personal items with other people in your household, like dishes, towels, and bedding. °10. Clean all surfaces that are touched often, like counters, tabletops, and doorknobs. Use household cleaning sprays or wipes according to the label instructions. °cdc.gov/coronavirus °09/21/2019 °This information is not intended to replace advice given to you by your health care provider. Make sure you discuss any questions you have with your health care provider. °Document Revised: 01/07/2020 Document Reviewed: 01/07/2020 °Elsevier Patient Education © 2021 Elsevier Inc. °If you have any questions or concerns after the infusion please call the Advanced Practice Provider on call at [phone]. This number is ONLY intended for your use regarding questions or concerns about the infusion post-treatment side-effects.  Please do not provide this number to others for use. For return to work notes please contact your primary care provider.   ° °If someone you know is interested in receiving treatment please have them contact their MD for a referral or visit [email or web address] ° ° ° °

## 2020-04-05 NOTE — Progress Notes (Signed)
Patient reviewed Fact Sheet for Patients, Parents, and Caregivers for Emergency Use Authorization (EUA) of Remdesivir for the Treatment of Coronavirus. Patient also reviewed and is agreeable to the estimated cost of treatment. Patient is agreeable to proceed.   

## 2020-04-05 NOTE — Progress Notes (Signed)
  Diagnosis: COVID-19  Physician: Patrick Wright, MD  Procedure: [facility] Med: remdesivir infusion - Provided patient with remdesivir fact sheet for patients, parents and caregivers prior to infusion.  Complications: No immediate complications noted.  Discharge: Discharged home   Carolyn Roberts 04/05/2020  

## 2020-04-07 ENCOUNTER — Ambulatory Visit (HOSPITAL_COMMUNITY)
Admission: RE | Admit: 2020-04-07 | Discharge: 2020-04-07 | Disposition: A | Discharge status: Home / Self Care | Payer: 59 | Source: Ambulatory Visit | Attending: Pulmonary Disease | Admitting: Pulmonary Disease

## 2020-04-07 DIAGNOSIS — J1282 Pneumonia due to coronavirus disease 2019: Secondary | ICD-10-CM | POA: Diagnosis not present

## 2020-04-07 DIAGNOSIS — U071 COVID-19: Secondary | ICD-10-CM | POA: Diagnosis present

## 2020-04-07 MED ORDER — ALBUTEROL SULFATE HFA 108 (90 BASE) MCG/ACT IN AERS: 2.0000 | INHALATION_SPRAY | Freq: Once | RESPIRATORY_TRACT | Status: DC | PRN

## 2020-04-07 MED ORDER — FAMOTIDINE IN NACL 20-0.9 MG/50ML-% IV SOLN: 20.0000 mg | Freq: Once | INTRAVENOUS | Status: DC | PRN

## 2020-04-07 MED ORDER — DIPHENHYDRAMINE HCL 50 MG/ML IJ SOLN: 50.0000 mg | Freq: Once | INTRAMUSCULAR | Status: DC | PRN

## 2020-04-07 MED ORDER — EPINEPHRINE 0.3 MG/0.3ML IJ SOAJ: 0.3000 mg | Freq: Once | INTRAMUSCULAR | Status: DC | PRN

## 2020-04-07 MED ORDER — SODIUM CHLORIDE 0.9 % IV SOLN: INTRAVENOUS | Status: DC | PRN

## 2020-04-07 MED ORDER — SODIUM CHLORIDE 0.9 % IV SOLN
100.0000 mg | Freq: Once | INTRAVENOUS | Status: AC
  Administered 2020-04-07: 100 mg via INTRAVENOUS

## 2020-04-07 MED ORDER — METHYLPREDNISOLONE SODIUM SUCC 125 MG IJ SOLR: 125.0000 mg | Freq: Once | INTRAMUSCULAR | Status: DC | PRN

## 2020-04-07 NOTE — Progress Notes (Signed)
  Diagnosis: COVID-19  Physician: Dr. Wright  Procedure: [facility] Infusion Clinic Med: remdesivir infusion - Provided patient with remdesivir fact sheet for patients, parents and caregivers prior to infusion.  Complications: No immediate complications noted.  Discharge: Discharged home   Smith, Fowler Richards 04/07/2020   

## 2020-04-07 NOTE — Discharge Instructions (Signed)
10 Things You Can Do to Manage Your COVID-19 Symptoms at Home °If you have possible or confirmed COVID-19: °1. Stay home except to get medical care. °2. Monitor your symptoms carefully. If your symptoms get worse, call your healthcare provider immediately. °3. Get rest and stay hydrated. °4. If you have a medical appointment, call the healthcare provider ahead of time and tell them that you have or may have COVID-19. °5. For medical emergencies, call 911 and notify the dispatch personnel that you have or may have COVID-19. °6. Cover your cough and sneezes with a tissue or use the inside of your elbow. °7. Wash your hands often with soap and water for at least 20 seconds or clean your hands with an alcohol-based hand sanitizer that contains at least 60% alcohol. °8. As much as possible, stay in a specific room and away from other people in your home. Also, you should use a separate bathroom, if available. If you need to be around other people in or outside of the home, wear a mask. °9. Avoid sharing personal items with other people in your household, like dishes, towels, and bedding. °10. Clean all surfaces that are touched often, like counters, tabletops, and doorknobs. Use household cleaning sprays or wipes according to the label instructions. °cdc.gov/coronavirus °09/21/2019 °This information is not intended to replace advice given to you by your health care provider. Make sure you discuss any questions you have with your health care provider. °Document Revised: 01/07/2020 Document Reviewed: 01/07/2020 °Elsevier Patient Education © 2021 Elsevier Inc. °If you have any questions or concerns after the infusion please call the Advanced Practice Provider on call at [phone]. This number is ONLY intended for your use regarding questions or concerns about the infusion post-treatment side-effects.  Please do not provide this number to others for use. For return to work notes please contact your primary care provider.   ° °If someone you know is interested in receiving treatment please have them contact their MD for a referral or visit [email or web address] ° ° ° °

## 2020-04-07 NOTE — Progress Notes (Signed)
Patient reviewed Fact Sheet for Patients, Parents, and Caregivers for Emergency Use Authorization (EUA) of remdesivir for the Treatment of Coronavirus. Patient also reviewed and is agreeable to the estimated cost of treatment. Patient is agreeable to proceed.    

## 2020-04-08 ENCOUNTER — Ambulatory Visit (HOSPITAL_COMMUNITY)
Admission: RE | Admit: 2020-04-08 | Discharge: 2020-04-08 | Disposition: A | Discharge status: Home / Self Care | Payer: 59 | Source: Ambulatory Visit | Attending: Pulmonary Disease | Admitting: Pulmonary Disease

## 2020-04-08 DIAGNOSIS — J1289 Other viral pneumonia: Secondary | ICD-10-CM | POA: Insufficient documentation

## 2020-04-08 DIAGNOSIS — U071 COVID-19: Secondary | ICD-10-CM | POA: Insufficient documentation

## 2020-04-08 MED ORDER — DIPHENHYDRAMINE HCL 50 MG/ML IJ SOLN: 50.0000 mg | Freq: Once | INTRAMUSCULAR | Status: DC | PRN

## 2020-04-08 MED ORDER — SODIUM CHLORIDE 0.9 % IV SOLN
100.0000 mg | Freq: Once | INTRAVENOUS | Status: AC
  Administered 2020-04-08: 100 mg via INTRAVENOUS

## 2020-04-08 MED ORDER — SODIUM CHLORIDE 0.9 % IV SOLN: INTRAVENOUS | Status: DC | PRN

## 2020-04-08 MED ORDER — FAMOTIDINE IN NACL 20-0.9 MG/50ML-% IV SOLN: 20.0000 mg | Freq: Once | INTRAVENOUS | Status: DC | PRN

## 2020-04-08 MED ORDER — EPINEPHRINE 0.3 MG/0.3ML IJ SOAJ: 0.3000 mg | Freq: Once | INTRAMUSCULAR | Status: DC | PRN

## 2020-04-08 MED ORDER — ALBUTEROL SULFATE HFA 108 (90 BASE) MCG/ACT IN AERS: 2.0000 | INHALATION_SPRAY | Freq: Once | RESPIRATORY_TRACT | Status: DC | PRN

## 2020-04-08 MED ORDER — METHYLPREDNISOLONE SODIUM SUCC 125 MG IJ SOLR: 125.0000 mg | Freq: Once | INTRAMUSCULAR | Status: DC | PRN

## 2020-04-08 NOTE — Discharge Instructions (Signed)
10 Things You Can Do to Manage Your COVID-19 Symptoms at Home °If you have possible or confirmed COVID-19: °1. Stay home except to get medical care. °2. Monitor your symptoms carefully. If your symptoms get worse, call your healthcare provider immediately. °3. Get rest and stay hydrated. °4. If you have a medical appointment, call the healthcare provider ahead of time and tell them that you have or may have COVID-19. °5. For medical emergencies, call 911 and notify the dispatch personnel that you have or may have COVID-19. °6. Cover your cough and sneezes with a tissue or use the inside of your elbow. °7. Wash your hands often with soap and water for at least 20 seconds or clean your hands with an alcohol-based hand sanitizer that contains at least 60% alcohol. °8. As much as possible, stay in a specific room and away from other people in your home. Also, you should use a separate bathroom, if available. If you need to be around other people in or outside of the home, wear a mask. °9. Avoid sharing personal items with other people in your household, like dishes, towels, and bedding. °10. Clean all surfaces that are touched often, like counters, tabletops, and doorknobs. Use household cleaning sprays or wipes according to the label instructions. °cdc.gov/coronavirus °09/21/2019 °This information is not intended to replace advice given to you by your health care provider. Make sure you discuss any questions you have with your health care provider. °Document Revised: 01/07/2020 Document Reviewed: 01/07/2020 °Elsevier Patient Education © 2021 Elsevier Inc. °If you have any questions or concerns after the infusion please call the Advanced Practice Provider on call at [phone]. This number is ONLY intended for your use regarding questions or concerns about the infusion post-treatment side-effects.  Please do not provide this number to others for use. For return to work notes please contact your primary care provider.   ° °If someone you know is interested in receiving treatment please have them contact their MD for a referral or visit [email or web address] ° ° ° °

## 2020-04-08 NOTE — Progress Notes (Signed)
  Diagnosis: COVID-19  Physician: Dr. Wright   Procedure: [facility] Infusion Clinic Med: remdesivir infusion - Provided patient with remdesivir fact sheet for patients, parents and caregivers prior to infusion.  Complications: No immediate complications noted.  Discharge: Discharged home   Jolaine Fryberger  B Mandeep Kiser 04/08/2020   

## 2020-04-08 NOTE — Progress Notes (Signed)
  Diagnosis: COVID-19  Physician: Dr. Patrick Wright  Procedure: Cov[facility] Infusion Clinic Med: remdesivir infusion - Provided patient with remdesivir fact sheet for patients, parents and caregivers prior to infusion.  Complications: No immediate complications noted.  Discharge: Discharged home   Karl Knarr 04/08/2020  

## 2021-03-10 DIAGNOSIS — Z20822 Contact with and (suspected) exposure to covid-19: Secondary | ICD-10-CM | POA: Diagnosis not present

## 2021-03-26 DIAGNOSIS — H26491 Other secondary cataract, right eye: Secondary | ICD-10-CM | POA: Diagnosis not present

## 2021-03-26 DIAGNOSIS — H25812 Combined forms of age-related cataract, left eye: Secondary | ICD-10-CM | POA: Diagnosis not present

## 2021-03-26 DIAGNOSIS — H04123 Dry eye syndrome of bilateral lacrimal glands: Secondary | ICD-10-CM | POA: Diagnosis not present

## 2021-03-27 DIAGNOSIS — E559 Vitamin D deficiency, unspecified: Secondary | ICD-10-CM | POA: Diagnosis not present

## 2021-03-27 DIAGNOSIS — I10 Essential (primary) hypertension: Secondary | ICD-10-CM | POA: Diagnosis not present

## 2021-03-27 DIAGNOSIS — R7301 Impaired fasting glucose: Secondary | ICD-10-CM | POA: Diagnosis not present

## 2021-03-27 DIAGNOSIS — E039 Hypothyroidism, unspecified: Secondary | ICD-10-CM | POA: Diagnosis not present

## 2021-03-30 DIAGNOSIS — L821 Other seborrheic keratosis: Secondary | ICD-10-CM | POA: Diagnosis not present

## 2021-03-30 DIAGNOSIS — D2260 Melanocytic nevi of unspecified upper limb, including shoulder: Secondary | ICD-10-CM | POA: Diagnosis not present

## 2021-03-30 DIAGNOSIS — L814 Other melanin hyperpigmentation: Secondary | ICD-10-CM | POA: Diagnosis not present

## 2021-03-30 DIAGNOSIS — L570 Actinic keratosis: Secondary | ICD-10-CM | POA: Diagnosis not present

## 2021-04-03 DIAGNOSIS — R82998 Other abnormal findings in urine: Secondary | ICD-10-CM | POA: Diagnosis not present

## 2021-05-29 DIAGNOSIS — D1801 Hemangioma of skin and subcutaneous tissue: Secondary | ICD-10-CM | POA: Diagnosis not present

## 2021-05-29 DIAGNOSIS — D2260 Melanocytic nevi of unspecified upper limb, including shoulder: Secondary | ICD-10-CM | POA: Diagnosis not present

## 2021-05-29 DIAGNOSIS — L814 Other melanin hyperpigmentation: Secondary | ICD-10-CM | POA: Diagnosis not present

## 2021-05-29 DIAGNOSIS — L821 Other seborrheic keratosis: Secondary | ICD-10-CM | POA: Diagnosis not present

## 2021-06-17 DIAGNOSIS — Z1231 Encounter for screening mammogram for malignant neoplasm of breast: Secondary | ICD-10-CM | POA: Diagnosis not present

## 2021-06-17 DIAGNOSIS — Z1289 Encounter for screening for malignant neoplasm of other sites: Secondary | ICD-10-CM | POA: Diagnosis not present

## 2021-06-17 DIAGNOSIS — Z01419 Encounter for gynecological examination (general) (routine) without abnormal findings: Secondary | ICD-10-CM | POA: Diagnosis not present

## 2021-08-07 DIAGNOSIS — E2749 Other adrenocortical insufficiency: Secondary | ICD-10-CM | POA: Diagnosis not present

## 2021-08-28 DIAGNOSIS — L578 Other skin changes due to chronic exposure to nonionizing radiation: Secondary | ICD-10-CM | POA: Diagnosis not present

## 2021-08-28 DIAGNOSIS — L821 Other seborrheic keratosis: Secondary | ICD-10-CM | POA: Diagnosis not present

## 2021-08-28 DIAGNOSIS — D225 Melanocytic nevi of trunk: Secondary | ICD-10-CM | POA: Diagnosis not present

## 2021-08-28 DIAGNOSIS — L814 Other melanin hyperpigmentation: Secondary | ICD-10-CM | POA: Diagnosis not present

## 2021-08-28 DIAGNOSIS — Z85820 Personal history of malignant melanoma of skin: Secondary | ICD-10-CM | POA: Diagnosis not present

## 2021-08-28 DIAGNOSIS — Z85828 Personal history of other malignant neoplasm of skin: Secondary | ICD-10-CM | POA: Diagnosis not present

## 2021-08-28 DIAGNOSIS — D2260 Melanocytic nevi of unspecified upper limb, including shoulder: Secondary | ICD-10-CM | POA: Diagnosis not present

## 2021-08-28 DIAGNOSIS — L570 Actinic keratosis: Secondary | ICD-10-CM | POA: Diagnosis not present

## 2021-08-28 DIAGNOSIS — D2270 Melanocytic nevi of unspecified lower limb, including hip: Secondary | ICD-10-CM | POA: Diagnosis not present

## 2021-08-28 DIAGNOSIS — D1801 Hemangioma of skin and subcutaneous tissue: Secondary | ICD-10-CM | POA: Diagnosis not present

## 2021-09-18 DIAGNOSIS — R197 Diarrhea, unspecified: Secondary | ICD-10-CM | POA: Diagnosis not present

## 2021-09-21 DIAGNOSIS — R197 Diarrhea, unspecified: Secondary | ICD-10-CM | POA: Diagnosis not present

## 2021-10-12 DIAGNOSIS — Z1231 Encounter for screening mammogram for malignant neoplasm of breast: Secondary | ICD-10-CM | POA: Diagnosis not present

## 2021-10-16 DIAGNOSIS — R197 Diarrhea, unspecified: Secondary | ICD-10-CM | POA: Diagnosis not present

## 2021-11-24 DIAGNOSIS — E038 Other specified hypothyroidism: Secondary | ICD-10-CM | POA: Diagnosis not present

## 2022-01-20 DIAGNOSIS — D485 Neoplasm of uncertain behavior of skin: Secondary | ICD-10-CM | POA: Diagnosis not present

## 2022-01-20 DIAGNOSIS — L814 Other melanin hyperpigmentation: Secondary | ICD-10-CM | POA: Diagnosis not present

## 2022-01-20 DIAGNOSIS — L578 Other skin changes due to chronic exposure to nonionizing radiation: Secondary | ICD-10-CM | POA: Diagnosis not present

## 2022-01-20 DIAGNOSIS — D2272 Melanocytic nevi of left lower limb, including hip: Secondary | ICD-10-CM | POA: Diagnosis not present

## 2022-01-20 DIAGNOSIS — D225 Melanocytic nevi of trunk: Secondary | ICD-10-CM | POA: Diagnosis not present

## 2022-01-20 DIAGNOSIS — D2261 Melanocytic nevi of right upper limb, including shoulder: Secondary | ICD-10-CM | POA: Diagnosis not present

## 2022-01-20 DIAGNOSIS — L821 Other seborrheic keratosis: Secondary | ICD-10-CM | POA: Diagnosis not present

## 2022-01-20 DIAGNOSIS — Z85828 Personal history of other malignant neoplasm of skin: Secondary | ICD-10-CM | POA: Diagnosis not present

## 2022-01-20 DIAGNOSIS — Z85820 Personal history of malignant melanoma of skin: Secondary | ICD-10-CM | POA: Diagnosis not present

## 2022-01-22 DIAGNOSIS — E2749 Other adrenocortical insufficiency: Secondary | ICD-10-CM | POA: Diagnosis not present

## 2022-01-22 DIAGNOSIS — E038 Other specified hypothyroidism: Secondary | ICD-10-CM | POA: Diagnosis not present

## 2022-02-26 ENCOUNTER — Encounter (HOSPITAL_BASED_OUTPATIENT_CLINIC_OR_DEPARTMENT_OTHER): Payer: Self-pay

## 2022-02-26 ENCOUNTER — Other Ambulatory Visit: Payer: Self-pay

## 2022-02-26 ENCOUNTER — Emergency Department (HOSPITAL_BASED_OUTPATIENT_CLINIC_OR_DEPARTMENT_OTHER)
Admission: EM | Admit: 2022-02-26 | Discharge: 2022-02-26 | Disposition: A | Discharge status: Home / Self Care | Payer: 59 | Attending: Emergency Medicine | Admitting: Emergency Medicine

## 2022-02-26 DIAGNOSIS — R197 Diarrhea, unspecified: Secondary | ICD-10-CM | POA: Insufficient documentation

## 2022-02-26 DIAGNOSIS — R110 Nausea: Secondary | ICD-10-CM | POA: Diagnosis not present

## 2022-02-26 DIAGNOSIS — A187 Tuberculosis of adrenal glands: Secondary | ICD-10-CM | POA: Diagnosis not present

## 2022-02-26 DIAGNOSIS — I10 Essential (primary) hypertension: Secondary | ICD-10-CM | POA: Diagnosis not present

## 2022-02-26 DIAGNOSIS — E039 Hypothyroidism, unspecified: Secondary | ICD-10-CM | POA: Insufficient documentation

## 2022-02-26 DIAGNOSIS — E876 Hypokalemia: Secondary | ICD-10-CM | POA: Insufficient documentation

## 2022-02-26 DIAGNOSIS — E2740 Unspecified adrenocortical insufficiency: Secondary | ICD-10-CM

## 2022-02-26 DIAGNOSIS — Z1152 Encounter for screening for COVID-19: Secondary | ICD-10-CM | POA: Insufficient documentation

## 2022-02-26 DIAGNOSIS — R112 Nausea with vomiting, unspecified: Secondary | ICD-10-CM | POA: Diagnosis not present

## 2022-02-26 LAB — RESP PANEL BY RT-PCR (RSV, FLU A&B, COVID)  RVPGX2
Influenza A by PCR: NEGATIVE
Influenza B by PCR: NEGATIVE
Resp Syncytial Virus by PCR: NEGATIVE
SARS Coronavirus 2 by RT PCR: NEGATIVE

## 2022-02-26 LAB — URINALYSIS, MICROSCOPIC (REFLEX)

## 2022-02-26 LAB — CBC WITH DIFFERENTIAL/PLATELET
Abs Immature Granulocytes: 0.01 10*3/uL (ref 0.00–0.07)
Basophils Absolute: 0 10*3/uL (ref 0.0–0.1)
Basophils Relative: 0 %
Eosinophils Absolute: 0.1 10*3/uL (ref 0.0–0.5)
Eosinophils Relative: 1 %
HCT: 43.1 % (ref 36.0–46.0)
Hemoglobin: 14.2 g/dL (ref 12.0–15.0)
Immature Granulocytes: 0 %
Lymphocytes Relative: 8 %
Lymphs Abs: 0.8 10*3/uL (ref 0.7–4.0)
MCH: 28.6 pg (ref 26.0–34.0)
MCHC: 32.9 g/dL (ref 30.0–36.0)
MCV: 86.7 fL (ref 80.0–100.0)
Monocytes Absolute: 0.5 10*3/uL (ref 0.1–1.0)
Monocytes Relative: 5 %
Neutro Abs: 8.9 10*3/uL — ABNORMAL HIGH (ref 1.7–7.7)
Neutrophils Relative %: 86 %
Platelets: 283 10*3/uL (ref 150–400)
RBC: 4.97 MIL/uL (ref 3.87–5.11)
RDW: 12.9 % (ref 11.5–15.5)
WBC: 10.3 10*3/uL (ref 4.0–10.5)
nRBC: 0 % (ref 0.0–0.2)

## 2022-02-26 LAB — COMPREHENSIVE METABOLIC PANEL
ALT: 15 U/L (ref 0–44)
AST: 20 U/L (ref 15–41)
Albumin: 4 g/dL (ref 3.5–5.0)
Alkaline Phosphatase: 51 U/L (ref 38–126)
Anion gap: 7 (ref 5–15)
BUN: 15 mg/dL (ref 8–23)
CO2: 28 mmol/L (ref 22–32)
Calcium: 9.1 mg/dL (ref 8.9–10.3)
Chloride: 104 mmol/L (ref 98–111)
Creatinine, Ser: 0.82 mg/dL (ref 0.44–1.00)
GFR, Estimated: >60 mL/min (ref >60)
Glucose, Bld: 111 mg/dL — ABNORMAL HIGH (ref 70–99)
Potassium: 3.2 mmol/L — ABNORMAL LOW (ref 3.5–5.1)
Sodium: 139 mmol/L (ref 135–145)
Total Bilirubin: 0.6 mg/dL (ref 0.3–1.2)
Total Protein: 7.4 g/dL (ref 6.5–8.1)

## 2022-02-26 LAB — LIPASE, BLOOD: Lipase: 37 U/L (ref 11–51)

## 2022-02-26 LAB — URINALYSIS, ROUTINE W REFLEX MICROSCOPIC
Glucose, UA: NEGATIVE mg/dL
Ketones, ur: NEGATIVE mg/dL
Leukocytes,Ua: NEGATIVE
Nitrite: NEGATIVE
Protein, ur: NEGATIVE mg/dL
Specific Gravity, Urine: 1.025 (ref 1.005–1.030)
pH: 6 (ref 5.0–8.0)

## 2022-02-26 MED ORDER — ONDANSETRON HCL 4 MG/2ML IJ SOLN
4.0000 mg | Freq: Once | INTRAMUSCULAR | Status: AC
  Administered 2022-02-26: 4 mg via INTRAVENOUS
  Filled 2022-02-26: qty 2

## 2022-02-26 MED ORDER — ONDANSETRON 4 MG PO TBDP: 4.0000 mg | ORAL_TABLET | Freq: Three times a day (TID) | ORAL | 0 refills | Status: AC | PRN

## 2022-02-26 MED ORDER — PREDNISONE 2.5 MG PO TABS: 2.5000 mg | ORAL_TABLET | Freq: Two times a day (BID) | ORAL | 0 refills | Status: AC

## 2022-02-26 MED ORDER — HYDROCORTISONE SOD SUC (PF) 100 MG IJ SOLR
50.0000 mg | Freq: Once | INTRAMUSCULAR | Status: AC
  Administered 2022-02-26: 50 mg via INTRAVENOUS

## 2022-02-26 MED ORDER — HYDROCORTISONE SOD SUC (PF) 100 MG IJ SOLR
INTRAMUSCULAR | Status: AC
  Filled 2022-02-26: qty 2

## 2022-02-26 MED ORDER — POTASSIUM CHLORIDE CRYS ER 20 MEQ PO TBCR
40.0000 meq | EXTENDED_RELEASE_TABLET | Freq: Once | ORAL | Status: AC
  Administered 2022-02-26: 40 meq via ORAL
  Filled 2022-02-26: qty 2

## 2022-02-26 MED ORDER — LACTATED RINGERS IV BOLUS
500.0000 mL | Freq: Once | INTRAVENOUS | Status: AC
  Administered 2022-02-26: 500 mL via INTRAVENOUS

## 2022-02-26 NOTE — ED Provider Notes (Signed)
[facility] EMERGENCY DEPARTMENT Provider Note   CSN: [id number] Arrival date & time: 02/26/22  9983     History  Chief Complaint  Patient presents with   Diarrhea    Carolyn Roberts is a 66 y.o. female.  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  Has been taking 2.'25mg'$  prednisone in AM, '1mg'$  after lunch, will change dosage based on activy level/stress.    Her physician advised her on sick days to take prednisone '5mg'$  in AM, '2mg'$  in afternoon, and was given prescription for IM solu-cortef.  Took '6mg'$  of prednisone this AM for stress dose, was able to take thyroid medication  Nausea this AM, took zofran, still felt nausea but did not vomit.  Diarrhea, 7-8 episodes, brown diarrhea, not black or tarry Said left flank was hurting, having chills, thirsty and nauseated.  Has had kidney stones in past doesn't feel like that, feels like a pain that comes and goes, no abdominal pain.  Not currently having flank pian. No urinary symptoms. No fevers. No cough, congestion, sore throat  No recent abx, other travel than [place], no suspicious foods, no known sick contacts  Did not eat since 5PM last night Had to drive to [place] for family emergency, was stressful situation, increased prednisone by '1mg'$ , thinks not drinking enough water then  [phone]    Home Medications Prior to Admission medications   Medication Sig Start Date End Date Taking? Authorizing Provider  ondansetron (ZOFRAN-ODT) 4 MG disintegrating tablet Take 1 tablet (4 mg total) by mouth every 8 (eight) hours as needed for nausea or vomiting. 02/26/22  Yes Gareth Morgan, MD  EPINEPHrine (EPIPEN JR) 0.15 MG/0.3ML injection Inject 0.15 mg into the muscle  as needed.    [provider]  levothyroxine (SYNTHROID, LEVOTHROID) 75 MCG tablet Take 37.5 mcg by mouth daily.  12/23/16 12/23/17  [provider]  Multiple Vitamin (MULTIVITAMIN) capsule Take 1 capsule by mouth daily.    [provider]  oxyCODONE (OXY IR/ROXICODONE) 5 MG immediate release tablet Take 1 tablet (5 mg total) by mouth every 4 (four) hours as needed for severe pain. 01/08/17   Rolm Bookbinder, MD  predniSONE (DELTASONE) 2.5 MG tablet Take 1 tablet (2.5 mg total) by mouth 2 (two) times daily. 02/26/22   Gareth Morgan, MD  traMADol (ULTRAM) 50 MG tablet Take 1 tablet (50 mg total) by mouth every 6 (six) hours. 01/08/17   Rolm Bookbinder, MD  UNKNOWN TO PATIENT Rx for hypertension-Pt can't remember name of med.    [provider]      Allergies    Dapsone    Review of Systems   Review of Systems  Physical Exam Updated Vital Signs BP 116/80   Pulse 80   Temp 97.9 F (36.6 C)   Resp 16   Ht '5\' 5"'$  (1.651 m)   Wt 68 kg   SpO2 100%   BMI 24.96 kg/m  Physical Exam Vitals and nursing note reviewed.  Constitutional:      General: She is not in acute distress.    Appearance: She is well-developed. She is not diaphoretic.  HENT:     Head: Normocephalic and atraumatic.  Eyes:     Conjunctiva/sclera: Conjunctivae normal.  Cardiovascular:     Rate and Rhythm: Normal rate  and regular rhythm.     Pulses: Normal pulses.  Pulmonary:     Effort: Pulmonary effort is normal. No respiratory distress.     Breath sounds: Normal breath sounds.  Abdominal:     General: There is no distension.     Palpations: Abdomen is soft.     Tenderness: There is no abdominal tenderness. There is no right CVA tenderness, left CVA tenderness or guarding.  Musculoskeletal:        General: No tenderness.     Cervical back: Normal range of motion.  Skin:    General: Skin is warm and dry.     Findings: No erythema or rash.  Neurological:     Mental  Status: She is alert and oriented to person, place, and time.     ED Results / Procedures / Treatments   Labs (all labs ordered are listed, but only abnormal results are displayed) Labs Reviewed  CBC WITH DIFFERENTIAL/PLATELET - Abnormal; Notable for the following components:      Result Value   Neutro Abs 8.9 (*)    All other components within normal limits  COMPREHENSIVE METABOLIC PANEL - Abnormal; Notable for the following components:   Potassium 3.2 (*)    Glucose, Bld 111 (*)    All other components within normal limits  URINALYSIS, ROUTINE W REFLEX MICROSCOPIC - Abnormal; Notable for the following components:   Hgb urine dipstick TRACE (*)    Bilirubin Urine SMALL (*)    All other components within normal limits  URINALYSIS, MICROSCOPIC (REFLEX) - Abnormal; Notable for the following components:   Bacteria, UA MANY (*)    All other components within normal limits  RESP PANEL BY RT-PCR (RSV, FLU A&B, COVID)  RVPGX2  LIPASE, BLOOD    EKG EKG Interpretation  Date/Time:  Friday February 26 2022 09:47:09 EST Ventricular Rate:  81 PR Interval:  155 QRS Duration: 80 QT Interval:  387 QTC Calculation: 450 R Axis:   26 Text Interpretation: Sinus rhythm Nonspecific T abnormalities, diffuse leads No significant change since last tracing Confirmed by Gareth Morgan [phone]) on 02/26/2022 10:06:13 AM  Radiology No results found.  Procedures Procedures    Medications Ordered in ED Medications  lactated ringers bolus 500 mL (0 mLs Intravenous Stopped 02/26/22 1205)  ondansetron (ZOFRAN) injection 4 mg (4 mg Intravenous Given 02/26/22 1125)  potassium chloride SA (KLOR-CON M) CR tablet 40 mEq (40 mEq Oral Given 02/26/22 1311)  hydrocortisone sodium succinate (SOLU-CORTEF) 100 MG injection 50 mg (50 mg Intravenous Given 02/26/22 1348)  ondansetron (ZOFRAN) injection 4 mg (4 mg Intravenous Given 02/26/22 1409)    ED Course/ Medical Decision Making/ A&P                             66 year old female with a history of hypoadrenalism, 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  Had reported left flank pain but does not have any flank pain, abdominal pain or tenderness at this time and doubt acute intraabdominal pathology/diverticuliits/nephrolithiasis.  Does not have CDiff risk factors.    Labs completed and personally evaluated by me showed mild hypokalemia, no other significant abnormalities.    Hemodynamically stable, tolerating po and appropriate for outpatient follow up.  Suspect likely viral gastroenteritis, discussed dosing of stress dose steroids in setting of being unable to  tolerate po.  Discussed with [facility] who recommends '50mg'$  IV hydrocortisone, and increase in outpt dose to '10mg'$ .  Discussed recommendations with patient and recommend follow up.  Given zofran rx and rx for additional steroids.   Patient discharged in stable condition with understanding of reasons to return.          Final Clinical Impression(s) / ED Diagnoses Final diagnoses:  Diarrhea of presumed infectious origin  Nausea  Hypoadrenalism ([place])    Rx / DC Orders ED Discharge Orders          Ordered    ondansetron (ZOFRAN-ODT) 4 MG disintegrating tablet  Every 8 hours PRN        02/26/22 1353    predniSONE (DELTASONE) 2.5 MG tablet  2 times daily        02/26/22 1414              Gareth Morgan, MD 02/26/22 2252

## 2022-02-26 NOTE — ED Triage Notes (Addendum)
Pt brought in by her daughter. Woke up 5 am with diarrhea. No vomiting at this time. Daughter reports questionable adrenal shock. Pain left flank area. Reports cold chills . Daughter has number for endocrinologist if needed

## 2022-02-26 NOTE — Discharge Instructions (Signed)
We discussed your steroid dosing with [facility] who recommended increasing your steroids to '10mg'$  daily while you are sick.

## 2022-03-09 DIAGNOSIS — M8589 Other specified disorders of bone density and structure, multiple sites: Secondary | ICD-10-CM | POA: Diagnosis not present

## 2022-03-12 DIAGNOSIS — L820 Inflamed seborrheic keratosis: Secondary | ICD-10-CM | POA: Diagnosis not present

## 2022-03-12 DIAGNOSIS — C44719 Basal cell carcinoma of skin of left lower limb, including hip: Secondary | ICD-10-CM | POA: Diagnosis not present

## 2022-04-05 DIAGNOSIS — E039 Hypothyroidism, unspecified: Secondary | ICD-10-CM | POA: Diagnosis not present

## 2022-04-05 DIAGNOSIS — E559 Vitamin D deficiency, unspecified: Secondary | ICD-10-CM | POA: Diagnosis not present

## 2022-04-05 DIAGNOSIS — E230 Hypopituitarism: Secondary | ICD-10-CM | POA: Diagnosis not present

## 2022-04-05 DIAGNOSIS — I10 Essential (primary) hypertension: Secondary | ICD-10-CM | POA: Diagnosis not present

## 2022-04-05 DIAGNOSIS — R7301 Impaired fasting glucose: Secondary | ICD-10-CM | POA: Diagnosis not present

## 2022-04-16 ENCOUNTER — Other Ambulatory Visit: Payer: Self-pay | Admitting: Internal Medicine

## 2022-04-16 DIAGNOSIS — Z8249 Family history of ischemic heart disease and other diseases of the circulatory system: Secondary | ICD-10-CM

## 2022-04-23 DIAGNOSIS — E038 Other specified hypothyroidism: Secondary | ICD-10-CM | POA: Diagnosis not present

## 2022-04-23 DIAGNOSIS — E230 Hypopituitarism: Secondary | ICD-10-CM | POA: Diagnosis not present

## 2022-04-23 DIAGNOSIS — E2749 Other adrenocortical insufficiency: Secondary | ICD-10-CM | POA: Diagnosis not present

## 2022-05-10 DIAGNOSIS — L821 Other seborrheic keratosis: Secondary | ICD-10-CM | POA: Diagnosis not present

## 2022-05-10 DIAGNOSIS — L814 Other melanin hyperpigmentation: Secondary | ICD-10-CM | POA: Diagnosis not present

## 2022-05-10 DIAGNOSIS — D225 Melanocytic nevi of trunk: Secondary | ICD-10-CM | POA: Diagnosis not present

## 2022-05-10 DIAGNOSIS — Z85828 Personal history of other malignant neoplasm of skin: Secondary | ICD-10-CM | POA: Diagnosis not present

## 2022-05-10 DIAGNOSIS — L570 Actinic keratosis: Secondary | ICD-10-CM | POA: Diagnosis not present

## 2022-05-10 DIAGNOSIS — Z85820 Personal history of malignant melanoma of skin: Secondary | ICD-10-CM | POA: Diagnosis not present

## 2022-05-10 DIAGNOSIS — D2270 Melanocytic nevi of unspecified lower limb, including hip: Secondary | ICD-10-CM | POA: Diagnosis not present

## 2022-05-10 DIAGNOSIS — D2260 Melanocytic nevi of unspecified upper limb, including shoulder: Secondary | ICD-10-CM | POA: Diagnosis not present

## 2022-05-10 DIAGNOSIS — D1801 Hemangioma of skin and subcutaneous tissue: Secondary | ICD-10-CM | POA: Diagnosis not present

## 2022-06-24 DIAGNOSIS — Z124 Encounter for screening for malignant neoplasm of cervix: Secondary | ICD-10-CM | POA: Diagnosis not present

## 2022-06-24 DIAGNOSIS — Z1289 Encounter for screening for malignant neoplasm of other sites: Secondary | ICD-10-CM | POA: Diagnosis not present

## 2022-06-24 DIAGNOSIS — Z01419 Encounter for gynecological examination (general) (routine) without abnormal findings: Secondary | ICD-10-CM | POA: Diagnosis not present

## 2022-06-24 DIAGNOSIS — Z1239 Encounter for other screening for malignant neoplasm of breast: Secondary | ICD-10-CM | POA: Diagnosis not present

## 2022-09-03 DIAGNOSIS — L820 Inflamed seborrheic keratosis: Secondary | ICD-10-CM | POA: Diagnosis not present

## 2022-09-03 DIAGNOSIS — L570 Actinic keratosis: Secondary | ICD-10-CM | POA: Diagnosis not present

## 2022-09-24 DIAGNOSIS — N6342 Unspecified lump in left breast, subareolar: Secondary | ICD-10-CM | POA: Diagnosis not present

## 2022-09-30 DIAGNOSIS — N6342 Unspecified lump in left breast, subareolar: Secondary | ICD-10-CM | POA: Diagnosis not present

## 2022-11-11 DIAGNOSIS — Z85820 Personal history of malignant melanoma of skin: Secondary | ICD-10-CM | POA: Diagnosis not present

## 2022-11-11 DIAGNOSIS — D1801 Hemangioma of skin and subcutaneous tissue: Secondary | ICD-10-CM | POA: Diagnosis not present

## 2022-11-11 DIAGNOSIS — D2260 Melanocytic nevi of unspecified upper limb, including shoulder: Secondary | ICD-10-CM | POA: Diagnosis not present

## 2022-11-11 DIAGNOSIS — L814 Other melanin hyperpigmentation: Secondary | ICD-10-CM | POA: Diagnosis not present

## 2022-11-11 DIAGNOSIS — L821 Other seborrheic keratosis: Secondary | ICD-10-CM | POA: Diagnosis not present

## 2022-11-11 DIAGNOSIS — D2270 Melanocytic nevi of unspecified lower limb, including hip: Secondary | ICD-10-CM | POA: Diagnosis not present

## 2022-11-11 DIAGNOSIS — Z85828 Personal history of other malignant neoplasm of skin: Secondary | ICD-10-CM | POA: Diagnosis not present

## 2022-11-11 DIAGNOSIS — D225 Melanocytic nevi of trunk: Secondary | ICD-10-CM | POA: Diagnosis not present

## 2022-12-02 DIAGNOSIS — Z1231 Encounter for screening mammogram for malignant neoplasm of breast: Secondary | ICD-10-CM | POA: Diagnosis not present

## 2022-12-02 DIAGNOSIS — R92323 Mammographic fibroglandular density, bilateral breasts: Secondary | ICD-10-CM | POA: Diagnosis not present

## 2022-12-13 DIAGNOSIS — H02834 Dermatochalasis of left upper eyelid: Secondary | ICD-10-CM | POA: Diagnosis not present

## 2022-12-13 DIAGNOSIS — H25812 Combined forms of age-related cataract, left eye: Secondary | ICD-10-CM | POA: Diagnosis not present

## 2022-12-13 DIAGNOSIS — H02831 Dermatochalasis of right upper eyelid: Secondary | ICD-10-CM | POA: Diagnosis not present

## 2022-12-13 DIAGNOSIS — Z961 Presence of intraocular lens: Secondary | ICD-10-CM | POA: Diagnosis not present

## 2023-02-17 ENCOUNTER — Encounter (HOSPITAL_BASED_OUTPATIENT_CLINIC_OR_DEPARTMENT_OTHER): Payer: Self-pay

## 2023-02-18 DIAGNOSIS — M546 Pain in thoracic spine: Secondary | ICD-10-CM | POA: Diagnosis not present

## 2023-03-30 DIAGNOSIS — R0981 Nasal congestion: Secondary | ICD-10-CM | POA: Diagnosis not present

## 2023-03-30 DIAGNOSIS — R051 Acute cough: Secondary | ICD-10-CM | POA: Diagnosis not present

## 2023-03-30 DIAGNOSIS — J09X2 Influenza due to identified novel influenza A virus with other respiratory manifestations: Secondary | ICD-10-CM | POA: Diagnosis not present

## 2023-04-05 DIAGNOSIS — J09X2 Influenza due to identified novel influenza A virus with other respiratory manifestations: Secondary | ICD-10-CM | POA: Diagnosis not present

## 2023-04-05 DIAGNOSIS — R051 Acute cough: Secondary | ICD-10-CM | POA: Diagnosis not present

## 2023-04-13 DIAGNOSIS — E230 Hypopituitarism: Secondary | ICD-10-CM | POA: Diagnosis not present

## 2023-04-19 DIAGNOSIS — E039 Hypothyroidism, unspecified: Secondary | ICD-10-CM | POA: Diagnosis not present

## 2023-04-19 DIAGNOSIS — R7301 Impaired fasting glucose: Secondary | ICD-10-CM | POA: Diagnosis not present

## 2023-04-19 DIAGNOSIS — R7989 Other specified abnormal findings of blood chemistry: Secondary | ICD-10-CM | POA: Diagnosis not present

## 2023-04-19 DIAGNOSIS — E559 Vitamin D deficiency, unspecified: Secondary | ICD-10-CM | POA: Diagnosis not present

## 2023-04-27 DIAGNOSIS — I10 Essential (primary) hypertension: Secondary | ICD-10-CM | POA: Diagnosis not present

## 2023-04-27 DIAGNOSIS — R82998 Other abnormal findings in urine: Secondary | ICD-10-CM | POA: Diagnosis not present

## 2023-04-27 DIAGNOSIS — Z0000 Encounter for general adult medical examination without abnormal findings: Secondary | ICD-10-CM | POA: Diagnosis not present

## 2023-04-27 DIAGNOSIS — Z23 Encounter for immunization: Secondary | ICD-10-CM | POA: Diagnosis not present

## 2023-04-27 DIAGNOSIS — E230 Hypopituitarism: Secondary | ICD-10-CM | POA: Diagnosis not present

## 2023-05-09 DIAGNOSIS — D1801 Hemangioma of skin and subcutaneous tissue: Secondary | ICD-10-CM | POA: Diagnosis not present

## 2023-05-09 DIAGNOSIS — Z85828 Personal history of other malignant neoplasm of skin: Secondary | ICD-10-CM | POA: Diagnosis not present

## 2023-05-09 DIAGNOSIS — L821 Other seborrheic keratosis: Secondary | ICD-10-CM | POA: Diagnosis not present

## 2023-05-09 DIAGNOSIS — L814 Other melanin hyperpigmentation: Secondary | ICD-10-CM | POA: Diagnosis not present

## 2023-06-13 DIAGNOSIS — H2512 Age-related nuclear cataract, left eye: Secondary | ICD-10-CM | POA: Diagnosis not present

## 2023-06-13 DIAGNOSIS — H02834 Dermatochalasis of left upper eyelid: Secondary | ICD-10-CM | POA: Diagnosis not present

## 2023-06-13 DIAGNOSIS — H02831 Dermatochalasis of right upper eyelid: Secondary | ICD-10-CM | POA: Diagnosis not present

## 2023-06-13 DIAGNOSIS — H57813 Brow ptosis, bilateral: Secondary | ICD-10-CM | POA: Diagnosis not present

## 2023-08-09 DIAGNOSIS — Z79890 Hormone replacement therapy (postmenopausal): Secondary | ICD-10-CM | POA: Diagnosis not present

## 2023-08-09 DIAGNOSIS — I10 Essential (primary) hypertension: Secondary | ICD-10-CM | POA: Diagnosis not present

## 2023-08-09 DIAGNOSIS — H02403 Unspecified ptosis of bilateral eyelids: Secondary | ICD-10-CM | POA: Diagnosis not present

## 2023-08-09 DIAGNOSIS — Z79899 Other long term (current) drug therapy: Secondary | ICD-10-CM | POA: Diagnosis not present

## 2023-08-09 DIAGNOSIS — Z7952 Long term (current) use of systemic steroids: Secondary | ICD-10-CM | POA: Diagnosis not present

## 2023-08-09 DIAGNOSIS — H02409 Unspecified ptosis of unspecified eyelid: Secondary | ICD-10-CM | POA: Diagnosis not present

## 2023-09-22 DIAGNOSIS — D485 Neoplasm of uncertain behavior of skin: Secondary | ICD-10-CM | POA: Diagnosis not present

## 2023-09-22 DIAGNOSIS — L03115 Cellulitis of right lower limb: Secondary | ICD-10-CM | POA: Diagnosis not present

## 2023-09-22 DIAGNOSIS — L820 Inflamed seborrheic keratosis: Secondary | ICD-10-CM | POA: Diagnosis not present

## 2023-09-22 DIAGNOSIS — D492 Neoplasm of unspecified behavior of bone, soft tissue, and skin: Secondary | ICD-10-CM | POA: Diagnosis not present

## 2023-10-11 DIAGNOSIS — L570 Actinic keratosis: Secondary | ICD-10-CM | POA: Diagnosis not present

## 2023-10-11 DIAGNOSIS — C44622 Squamous cell carcinoma of skin of right upper limb, including shoulder: Secondary | ICD-10-CM | POA: Diagnosis not present

## 2023-11-15 DIAGNOSIS — L814 Other melanin hyperpigmentation: Secondary | ICD-10-CM | POA: Diagnosis not present

## 2023-11-15 DIAGNOSIS — D1801 Hemangioma of skin and subcutaneous tissue: Secondary | ICD-10-CM | POA: Diagnosis not present

## 2023-11-15 DIAGNOSIS — L821 Other seborrheic keratosis: Secondary | ICD-10-CM | POA: Diagnosis not present

## 2023-11-15 DIAGNOSIS — L570 Actinic keratosis: Secondary | ICD-10-CM | POA: Diagnosis not present

## 2023-11-21 DIAGNOSIS — H02831 Dermatochalasis of right upper eyelid: Secondary | ICD-10-CM | POA: Diagnosis not present

## 2023-11-21 DIAGNOSIS — H02834 Dermatochalasis of left upper eyelid: Secondary | ICD-10-CM | POA: Diagnosis not present

## 2023-12-06 DIAGNOSIS — Z1231 Encounter for screening mammogram for malignant neoplasm of breast: Secondary | ICD-10-CM | POA: Diagnosis not present

## 2023-12-06 DIAGNOSIS — R92323 Mammographic fibroglandular density, bilateral breasts: Secondary | ICD-10-CM | POA: Diagnosis not present

## 2023-12-27 DIAGNOSIS — L821 Other seborrheic keratosis: Secondary | ICD-10-CM | POA: Diagnosis not present
